# Patient Record
Sex: Male | Born: 2000 | Race: Black or African American | Hispanic: No | Marital: Single | Smoking: Never smoker
Health system: Southern US, Community
[De-identification: ages and names within clinical notes are randomized; demographics above are authoritative.]

## PROBLEM LIST (undated history)

## (undated) DIAGNOSIS — D571 Sickle-cell disease without crisis: Secondary | ICD-10-CM

## (undated) DIAGNOSIS — J45909 Unspecified asthma, uncomplicated: Secondary | ICD-10-CM

## (undated) DIAGNOSIS — H539 Unspecified visual disturbance: Secondary | ICD-10-CM

## (undated) DIAGNOSIS — K011 Impacted teeth: Secondary | ICD-10-CM

## (undated) DIAGNOSIS — T7840XA Allergy, unspecified, initial encounter: Secondary | ICD-10-CM

## (undated) DIAGNOSIS — F909 Attention-deficit hyperactivity disorder, unspecified type: Secondary | ICD-10-CM

## (undated) DIAGNOSIS — R079 Chest pain, unspecified: Secondary | ICD-10-CM

## (undated) DIAGNOSIS — J189 Pneumonia, unspecified organism: Secondary | ICD-10-CM

## (undated) HISTORY — PX: NO PAST SURGERIES: SHX2092

## (undated) HISTORY — DX: Unspecified asthma, uncomplicated: J45.909

## (undated) HISTORY — DX: Sickle-cell disease without crisis: D57.1

---
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
## 2022-02-17 ENCOUNTER — Emergency Department (HOSPITAL_COMMUNITY): Payer: Medicaid Other

## 2022-02-17 ENCOUNTER — Other Ambulatory Visit: Payer: Self-pay

## 2022-02-17 ENCOUNTER — Encounter (HOSPITAL_COMMUNITY): Payer: Self-pay | Admitting: Emergency Medicine

## 2022-02-17 ENCOUNTER — Emergency Department (HOSPITAL_COMMUNITY)
Admission: EM | Admit: 2022-02-17 | Discharge: 2022-02-17 | Disposition: A | Discharge status: Home / Self Care | Payer: Medicaid Other | Attending: Emergency Medicine | Admitting: Emergency Medicine

## 2022-02-17 DIAGNOSIS — J45909 Unspecified asthma, uncomplicated: Secondary | ICD-10-CM | POA: Diagnosis not present

## 2022-02-17 DIAGNOSIS — D5700 Hb-SS disease with crisis, unspecified: Secondary | ICD-10-CM | POA: Diagnosis present

## 2022-02-17 LAB — CBC WITH DIFFERENTIAL/PLATELET
Abs Immature Granulocytes: 0.05 10*3/uL (ref 0.00–0.07)
Basophils Absolute: 0 10*3/uL (ref 0.0–0.1)
Basophils Relative: 0 %
Eosinophils Absolute: 0.1 10*3/uL (ref 0.0–0.5)
Eosinophils Relative: 1 %
HCT: 35.8 % — ABNORMAL LOW (ref 39.0–52.0)
Hemoglobin: 12.4 g/dL — ABNORMAL LOW (ref 13.0–17.0)
Immature Granulocytes: 1 %
Lymphocytes Relative: 30 %
Lymphs Abs: 2 10*3/uL (ref 0.7–4.0)
MCH: 23.6 pg — ABNORMAL LOW (ref 26.0–34.0)
MCHC: 34.6 g/dL (ref 30.0–36.0)
MCV: 68.1 fL — ABNORMAL LOW (ref 80.0–100.0)
Monocytes Absolute: 0.5 10*3/uL (ref 0.1–1.0)
Monocytes Relative: 8 %
Neutro Abs: 4 10*3/uL (ref 1.7–7.7)
Neutrophils Relative %: 60 %
Platelets: 156 10*3/uL (ref 150–400)
RBC: 5.26 MIL/uL (ref 4.22–5.81)
RDW: 15.6 % — ABNORMAL HIGH (ref 11.5–15.5)
WBC: 6.7 10*3/uL (ref 4.0–10.5)
nRBC: 0 % (ref 0.0–0.2)

## 2022-02-17 LAB — COMPREHENSIVE METABOLIC PANEL
ALT: 17 U/L (ref 0–44)
AST: 18 U/L (ref 15–41)
Albumin: 4.7 g/dL (ref 3.5–5.0)
Alkaline Phosphatase: 60 U/L (ref 38–126)
Anion gap: 6 (ref 5–15)
BUN: 7 mg/dL (ref 6–20)
CO2: 26 mmol/L (ref 22–32)
Calcium: 9.2 mg/dL (ref 8.9–10.3)
Chloride: 106 mmol/L (ref 98–111)
Creatinine, Ser: 0.77 mg/dL (ref 0.61–1.24)
GFR, Estimated: >60 mL/min (ref >60)
Glucose, Bld: 104 mg/dL — ABNORMAL HIGH (ref 70–99)
Potassium: 3.5 mmol/L (ref 3.5–5.1)
Sodium: 138 mmol/L (ref 135–145)
Total Bilirubin: 2 mg/dL — ABNORMAL HIGH (ref 0.3–1.2)
Total Protein: 7.3 g/dL (ref 6.5–8.1)

## 2022-02-17 LAB — RETICULOCYTES
Immature Retic Fract: 17.9 % — ABNORMAL HIGH (ref 2.3–15.9)
RBC.: 5.21 MIL/uL (ref 4.22–5.81)
Retic Count, Absolute: 89.6 10*3/uL (ref 19.0–186.0)
Retic Ct Pct: 1.7 % (ref 0.4–3.1)

## 2022-02-17 MED ORDER — OXYCODONE-ACETAMINOPHEN 5-325 MG PO TABS: 1.0000 | ORAL_TABLET | Freq: Four times a day (QID) | ORAL | 0 refills | Status: DC | PRN

## 2022-02-17 MED ORDER — SODIUM CHLORIDE 0.45 % IV SOLN: INTRAVENOUS | Status: DC

## 2022-02-17 MED ORDER — ONDANSETRON HCL 4 MG/2ML IJ SOLN
4.0000 mg | INTRAMUSCULAR | Status: DC | PRN
  Filled 2022-02-17: qty 2

## 2022-02-17 MED ORDER — HYDROMORPHONE HCL 2 MG/ML IJ SOLN
2.0000 mg | INTRAMUSCULAR | Status: AC
  Administered 2022-02-17: 2 mg via INTRAVENOUS
  Filled 2022-02-17: qty 1

## 2022-02-17 MED ORDER — SODIUM CHLORIDE 0.9 % IV SOLN
12.5000 mg | Freq: Once | INTRAVENOUS | Status: AC
  Administered 2022-02-17: 12.5 mg via INTRAVENOUS
  Filled 2022-02-17: qty 12.5

## 2022-02-17 MED ORDER — OXYCODONE HCL 5 MG PO TABS
10.0000 mg | ORAL_TABLET | Freq: Once | ORAL | Status: AC
  Administered 2022-02-17: 10 mg via ORAL
  Filled 2022-02-17: qty 2

## 2022-02-17 MED ORDER — HYDROMORPHONE HCL 2 MG/ML IJ SOLN
2.0000 mg | Freq: Once | INTRAMUSCULAR | Status: AC
  Administered 2022-02-17: 2 mg via INTRAVENOUS
  Filled 2022-02-17: qty 1

## 2022-02-17 MED ORDER — KETOROLAC TROMETHAMINE 15 MG/ML IJ SOLN
15.0000 mg | INTRAMUSCULAR | Status: AC
  Administered 2022-02-17: 15 mg via INTRAVENOUS
  Filled 2022-02-17: qty 1

## 2022-02-17 MED ORDER — OXYCODONE-ACETAMINOPHEN 5-325 MG PO TABS
2.0000 | ORAL_TABLET | Freq: Once | ORAL | Status: AC
  Administered 2022-02-17: 2 via ORAL
  Filled 2022-02-17: qty 2

## 2022-02-17 NOTE — Discharge Instructions (Addendum)
You were seen in the emergency department today for sickle cell crisis.  I am glad that we were able to get your pain under control.  Your labs and imaging today overall looked reassuring.  I am prescribing you a short course of pain medication for you to take at home.  I like you to touch base with your primary care doctor, to discuss long-term management of your symptoms.  Continue to monitor how you are doing and return to the emergency department for any new or worsening symptoms such as fever, persistent chest pain, shortness of breath or cough.

## 2022-02-17 NOTE — ED Provider Triage Note (Signed)
Emergency Medicine Provider Triage Evaluation Note  Maurice Ferguson , a 21 y.o. male  was evaluated in triage.  Pt complains of crisis pain.  Pt complains of pain inn his chest and back  Review of Systems  Positive: pain Negative: Fever  no cough  Physical Exam  BP (!) 161/93 (BP Location: Right Arm)    Pulse 98    Temp 98.5 F (36.9 C) (Oral)    Resp 18    SpO2 100%  Gen:   Awake, no distress   Resp:  Normal effort  MSK:   Moves extremities without difficulty  Other:    Medical Decision Making  Medically screening exam initiated at 5:14 PM.  Appropriate orders placed.  Maurice Ferguson was informed that the remainder of the evaluation will be completed by another provider, this initial triage assessment does not replace that evaluation, and the importance of remaining in the ED until their evaluation is complete.  Sickle cell protocol ordered    Elson Areas, PA-C 02/17/22 1715

## 2022-02-17 NOTE — ED Triage Notes (Signed)
BIBA Per EMS: Pt coming from home w/ c/o SCC. Pt reports Pain everywhere.  VSS

## 2022-02-17 NOTE — ED Provider Notes (Signed)
[facility] DEPT Provider Note   CSN: [id number] Arrival date & time: 02/17/22  1633     History  Chief Complaint  Patient presents with   Sickle Cell Pain Crisis    Maurice Ferguson is a 21 y.o. male with history of sickle cell disease who presents the emergency department complaining of sickle cell crisis.  Patient states that he is having pain "all over", but worse in his chest and his back.  He initially had shortness of breath upon ER arrival, states this is gotten slightly better.  No fever, chills, cough.   Sickle Cell Pain Crisis Associated symptoms: chest pain   Associated symptoms: no cough, no fever, no nausea, no shortness of breath and no vomiting       Home Medications Prior to Admission medications   Medication Sig Start Date End Date Taking? Authorizing Provider  oxyCODONE-acetaminophen (PERCOCET/ROXICET) 5-325 MG tablet Take 1 tablet by mouth every 6 (six) hours as needed for severe pain. 02/17/22  Yes Lorenz Donley T, PA-C  acetaminophen (TYLENOL) 500 MG tablet Take 500 mg by mouth every 6 (six) hours as needed (pain).    [provider]  albuterol (VENTOLIN HFA) 108 (90 Base) MCG/ACT inhaler Inhale 2 puffs into the lungs every 6 (six) hours as needed for wheezing or shortness of breath.    [provider]  oxyCODONE (ROXICODONE) 5 MG immediate release tablet Take 1 tablet (5 mg total) by mouth every 8 (eight) hours as needed. Patient not taking: No sig reported 06/12/21 06/12/22  Elwyn Reach, MD      Allergies    Patient has no known allergies.    Review of Systems   Review of Systems  Constitutional:  Negative for chills and fever.  Respiratory:  Negative for cough and shortness of breath.   Cardiovascular:  Positive for chest pain.  Gastrointestinal:  Negative for nausea and vomiting.  Musculoskeletal:  Positive for back pain and myalgias.  All other systems reviewed and are negative.  Physical  Exam Updated Vital Signs BP (!) 142/98    Pulse 76    Temp 98.5 F (36.9 C) (Oral)    Resp (!) 22    SpO2 100%  Physical Exam Vitals and nursing note reviewed.  Constitutional:      Appearance: Normal appearance.  HENT:     Head: Normocephalic and atraumatic.  Eyes:     Conjunctiva/sclera: Conjunctivae normal.  Cardiovascular:     Rate and Rhythm: Normal rate and regular rhythm.  Pulmonary:     Effort: Pulmonary effort is normal. No respiratory distress.     Breath sounds: Normal breath sounds.  Abdominal:     General: There is no distension.     Palpations: Abdomen is soft.     Tenderness: There is no abdominal tenderness.  Skin:    General: Skin is warm and dry.  Neurological:     General: No focal deficit present.     Mental Status: He is alert.    ED Results / Procedures / Treatments   Labs (all labs ordered are listed, but only abnormal results are displayed) Labs Reviewed  CBC WITH DIFFERENTIAL/PLATELET - Abnormal; Notable for the following components:      Result Value   Hemoglobin 12.4 (*)    HCT 35.8 (*)    MCV 68.1 (*)    MCH 23.6 (*)    RDW 15.6 (*)    All other components within normal limits  RETICULOCYTES -  Abnormal; Notable for the following components:   Immature Retic Fract 17.9 (*)    All other components within normal limits  COMPREHENSIVE METABOLIC PANEL - Abnormal; Notable for the following components:   Glucose, Bld 104 (*)    Total Bilirubin 2.0 (*)    All other components within normal limits    EKG None  Radiology DG Chest 2 View  Result Date: 02/17/2022 CLINICAL DATA:  Chest pain, sickle cell disease EXAM: CHEST - 2 VIEW COMPARISON:  07/04/2021 FINDINGS: The heart size and mediastinal contours are within normal limits. Both lungs are clear. The visualized skeletal structures are unremarkable. IMPRESSION: No active cardiopulmonary disease. Electronically Signed   By: Elmer Picker M.D.   On: 02/17/2022 19:33     Procedures Procedures    Medications Ordered in ED Medications  0.45 % sodium chloride infusion ( Intravenous New Bag/Given 02/17/22 1740)  ondansetron (ZOFRAN) injection 4 mg (has no administration in time range)  ketorolac (TORADOL) 15 MG/ML injection 15 mg (15 mg Intravenous Given 02/17/22 1740)  HYDROmorphone (DILAUDID) injection 2 mg (2 mg Intravenous Given 02/17/22 1740)  diphenhydrAMINE (BENADRYL) 12.5 mg in sodium chloride 0.9 % 50 mL IVPB (0 mg Intravenous Stopped 02/17/22 1818)  HYDROmorphone (DILAUDID) injection 2 mg (2 mg Intravenous Given 02/17/22 1946)  oxyCODONE (Oxy IR/ROXICODONE) immediate release tablet 10 mg (10 mg Oral Given 02/17/22 2055)  oxyCODONE-acetaminophen (PERCOCET/ROXICET) 5-325 MG per tablet 2 tablet (2 tablets Oral Given 02/17/22 2130)    ED Course/ Medical Decision Making/ A&P                           Medical Decision Making Amount and/or Complexity of Data Reviewed Radiology: ordered.  Risk Prescription drug management.   This patient is a 21 year old male who presents to the ED for concern of sickle cell crisis.   Differential diagnoses prior to evaluation: The emergent differential diagnosis includes, but is not limited to, sickle cell crisis, acute chest syndrome, sequestration, anaplastic anemia.   Past Medical History / Co-morbidities: Sickle cell anemia, asthma, ADHD  Physical Exam: Physical exam performed. The pertinent findings include: Patient is afebrile, not tachycardic, not hypoxic, no acute distress.  Complaining of generalized myalgias, worse in the chest and the back.  No reproducible tenderness.  Lab Tests/Imaging studies: I Ordered, and personally interpreted labs/imaging including CBC, CMP, reticulocytes, chest x-ray.  The pertinent results include: Hemoglobin 12.4, stable compared to prior.  Absolute reticulocyte count 89.6.  Electrolytes within normal limits.  Chest x-ray showed no acute cardiopulmonary abnormalities. I agree  with the radiologist interpretation.   Medications: I ordered medication including IV fluids, analgesia, antiemetics for sickle cell crisis.  I have reviewed the patients home medicines and have made adjustments as needed.   Disposition: After consideration of the diagnostic results and the patients response to treatment, I feel that patient's not requiring admission or inpatient treatment for his symptoms.  I feel reassured that we are able to control his pain while in the emergency department.  Patient got 2 doses of IV pain medication, and successfully transition to oral medication.  As he does not have medication to take on a daily basis, we will prescribe him a short course of pain medicine for home.  As he continues to have good oxygen saturation, clear lungs, and a normal chest x-ray, I have low concern for acute chest syndrome.  Patient is stable for discharge home.  Discussed reasons to return to the emergency  department, and the patient is agreeable to the plan.   Final Clinical Impression(s) / ED Diagnoses Final diagnoses:  Sickle cell pain crisis ([place])    Rx / DC Orders ED Discharge Orders          Ordered    oxyCODONE-acetaminophen (PERCOCET/ROXICET) 5-325 MG tablet  Every 6 hours PRN        02/17/22 2136           Portions of this report may have been transcribed using voice recognition software. Every effort was made to ensure accuracy; however, inadvertent computerized transcription errors may be present.    Estill Cotta 02/17/22 2136    Tegeler, Gwenyth Allegra, MD 02/17/22 [phone]

## 2022-07-08 ENCOUNTER — Emergency Department (HOSPITAL_COMMUNITY): Payer: Medicaid Other

## 2022-07-08 ENCOUNTER — Other Ambulatory Visit: Payer: Self-pay

## 2022-07-08 ENCOUNTER — Emergency Department (HOSPITAL_COMMUNITY)
Admission: EM | Admit: 2022-07-08 | Discharge: 2022-07-09 | Disposition: A | Discharge status: Home / Self Care | Payer: Medicaid Other | Source: Home / Self Care | Attending: Emergency Medicine | Admitting: Emergency Medicine

## 2022-07-08 ENCOUNTER — Encounter (HOSPITAL_COMMUNITY): Payer: Self-pay

## 2022-07-08 DIAGNOSIS — D638 Anemia in other chronic diseases classified elsewhere: Secondary | ICD-10-CM | POA: Insufficient documentation

## 2022-07-08 DIAGNOSIS — I10 Essential (primary) hypertension: Secondary | ICD-10-CM | POA: Insufficient documentation

## 2022-07-08 DIAGNOSIS — D696 Thrombocytopenia, unspecified: Secondary | ICD-10-CM | POA: Diagnosis not present

## 2022-07-08 DIAGNOSIS — M549 Dorsalgia, unspecified: Secondary | ICD-10-CM | POA: Diagnosis present

## 2022-07-08 DIAGNOSIS — E876 Hypokalemia: Secondary | ICD-10-CM | POA: Insufficient documentation

## 2022-07-08 DIAGNOSIS — D72829 Elevated white blood cell count, unspecified: Secondary | ICD-10-CM | POA: Diagnosis not present

## 2022-07-08 DIAGNOSIS — D57219 Sickle-cell/Hb-C disease with crisis, unspecified: Secondary | ICD-10-CM | POA: Diagnosis not present

## 2022-07-08 DIAGNOSIS — J4530 Mild persistent asthma, uncomplicated: Secondary | ICD-10-CM | POA: Diagnosis not present

## 2022-07-08 DIAGNOSIS — D5700 Hb-SS disease with crisis, unspecified: Secondary | ICD-10-CM | POA: Insufficient documentation

## 2022-07-08 DIAGNOSIS — J45909 Unspecified asthma, uncomplicated: Secondary | ICD-10-CM | POA: Insufficient documentation

## 2022-07-08 DIAGNOSIS — G8929 Other chronic pain: Secondary | ICD-10-CM | POA: Insufficient documentation

## 2022-07-08 LAB — COMPREHENSIVE METABOLIC PANEL
ALT: 17 U/L (ref 0–44)
AST: 19 U/L (ref 15–41)
Albumin: 4.8 g/dL (ref 3.5–5.0)
Alkaline Phosphatase: 82 U/L (ref 38–126)
Anion gap: 7 (ref 5–15)
BUN: 7 mg/dL (ref 6–20)
CO2: 28 mmol/L (ref 22–32)
Calcium: 9.1 mg/dL (ref 8.9–10.3)
Chloride: 110 mmol/L (ref 98–111)
Creatinine, Ser: 0.75 mg/dL (ref 0.61–1.24)
GFR, Estimated: >60 mL/min (ref >60)
Glucose, Bld: 99 mg/dL (ref 70–99)
Potassium: 3.4 mmol/L — ABNORMAL LOW (ref 3.5–5.1)
Sodium: 145 mmol/L (ref 135–145)
Total Bilirubin: 1.4 mg/dL — ABNORMAL HIGH (ref 0.3–1.2)
Total Protein: 7.5 g/dL (ref 6.5–8.1)

## 2022-07-08 LAB — CBC WITH DIFFERENTIAL/PLATELET
Abs Immature Granulocytes: 0.14 10*3/uL — ABNORMAL HIGH (ref 0.00–0.07)
Basophils Absolute: 0 10*3/uL (ref 0.0–0.1)
Basophils Relative: 0 %
Eosinophils Absolute: 0.2 10*3/uL (ref 0.0–0.5)
Eosinophils Relative: 2 %
HCT: 35.4 % — ABNORMAL LOW (ref 39.0–52.0)
Hemoglobin: 12.2 g/dL — ABNORMAL LOW (ref 13.0–17.0)
Immature Granulocytes: 1 %
Lymphocytes Relative: 22 %
Lymphs Abs: 2.2 10*3/uL (ref 0.7–4.0)
MCH: 23.5 pg — ABNORMAL LOW (ref 26.0–34.0)
MCHC: 34.5 g/dL (ref 30.0–36.0)
MCV: 68.2 fL — ABNORMAL LOW (ref 80.0–100.0)
Monocytes Absolute: 0.6 10*3/uL (ref 0.1–1.0)
Monocytes Relative: 6 %
Neutro Abs: 6.6 10*3/uL (ref 1.7–7.7)
Neutrophils Relative %: 69 %
Platelets: 152 10*3/uL (ref 150–400)
RBC: 5.19 MIL/uL (ref 4.22–5.81)
RDW: 15.8 % — ABNORMAL HIGH (ref 11.5–15.5)
WBC: 9.8 10*3/uL (ref 4.0–10.5)
nRBC: 0 % (ref 0.0–0.2)

## 2022-07-08 LAB — RETICULOCYTES
Immature Retic Fract: 17.4 % — ABNORMAL HIGH (ref 2.3–15.9)
RBC.: 5.21 MIL/uL (ref 4.22–5.81)
Retic Count, Absolute: 68.8 10*3/uL (ref 19.0–186.0)
Retic Ct Pct: 1.3 % (ref 0.4–3.1)

## 2022-07-08 MED ORDER — SODIUM CHLORIDE 0.9 % IV SOLN
12.5000 mg | Freq: Once | INTRAVENOUS | Status: AC
  Administered 2022-07-08: 12.5 mg via INTRAVENOUS
  Filled 2022-07-08: qty 12.5

## 2022-07-08 MED ORDER — HYDROMORPHONE HCL 2 MG/ML IJ SOLN: 2.0000 mg | INTRAMUSCULAR | Status: AC

## 2022-07-08 MED ORDER — ONDANSETRON HCL 4 MG/2ML IJ SOLN
4.0000 mg | INTRAMUSCULAR | Status: DC | PRN
  Administered 2022-07-08: 4 mg via INTRAVENOUS
  Filled 2022-07-08: qty 2

## 2022-07-08 MED ORDER — OXYCODONE-ACETAMINOPHEN 5-325 MG PO TABS
1.0000 | ORAL_TABLET | Freq: Once | ORAL | Status: AC
  Administered 2022-07-08: 1 via ORAL
  Filled 2022-07-08: qty 1

## 2022-07-08 MED ORDER — KETOROLAC TROMETHAMINE 15 MG/ML IJ SOLN
15.0000 mg | INTRAMUSCULAR | Status: AC
  Administered 2022-07-08: 15 mg via INTRAVENOUS
  Filled 2022-07-08: qty 1

## 2022-07-08 MED ORDER — HYDROMORPHONE HCL 2 MG/ML IJ SOLN
2.0000 mg | INTRAMUSCULAR | Status: AC
  Administered 2022-07-08: 2 mg via INTRAVENOUS
  Filled 2022-07-08: qty 1

## 2022-07-08 MED ORDER — ONDANSETRON 4 MG PO TBDP
4.0000 mg | ORAL_TABLET | Freq: Once | ORAL | Status: AC
  Administered 2022-07-08: 4 mg via ORAL
  Filled 2022-07-08: qty 1
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
## 2022-07-08 NOTE — ED Triage Notes (Signed)
Pt reports with [facility] since today. Pt is complaining of lower back pain.
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
## 2022-07-09 ENCOUNTER — Emergency Department (HOSPITAL_COMMUNITY): Payer: Medicaid Other

## 2022-07-09 ENCOUNTER — Other Ambulatory Visit: Payer: Self-pay

## 2022-07-09 ENCOUNTER — Encounter (HOSPITAL_COMMUNITY): Payer: Self-pay

## 2022-07-09 ENCOUNTER — Observation Stay (HOSPITAL_COMMUNITY)
Admission: EM | Admit: 2022-07-09 | Discharge: 2022-07-11 | Disposition: A | Discharge status: Home / Self Care | Payer: Medicaid Other | Attending: Internal Medicine | Admitting: Internal Medicine

## 2022-07-09 DIAGNOSIS — J4530 Mild persistent asthma, uncomplicated: Secondary | ICD-10-CM | POA: Diagnosis present

## 2022-07-09 DIAGNOSIS — D5700 Hb-SS disease with crisis, unspecified: Secondary | ICD-10-CM

## 2022-07-09 DIAGNOSIS — I10 Essential (primary) hypertension: Secondary | ICD-10-CM | POA: Diagnosis present

## 2022-07-09 LAB — RETICULOCYTES
Immature Retic Fract: 22.2 % — ABNORMAL HIGH (ref 2.3–15.9)
RBC.: 5.09 MIL/uL (ref 4.22–5.81)
Retic Count, Absolute: 72.3 10*3/uL (ref 19.0–186.0)
Retic Ct Pct: 1.4 % (ref 0.4–3.1)

## 2022-07-09 MED ORDER — SODIUM CHLORIDE 0.45 % IV SOLN: INTRAVENOUS | Status: DC

## 2022-07-09 MED ORDER — HYDROMORPHONE HCL 2 MG/ML IJ SOLN: 2.0000 mg | INTRAMUSCULAR | Status: DC

## 2022-07-09 MED ORDER — ONDANSETRON HCL 4 MG/2ML IJ SOLN
4.0000 mg | INTRAMUSCULAR | Status: DC | PRN
  Administered 2022-07-10: 4 mg via INTRAVENOUS
  Filled 2022-07-09: qty 2

## 2022-07-09 MED ORDER — HYDROMORPHONE HCL 1 MG/ML IJ SOLN
1.0000 mg | INTRAMUSCULAR | Status: AC
  Administered 2022-07-09: 1 mg via INTRAVENOUS
  Filled 2022-07-09: qty 1

## 2022-07-09 MED ORDER — SODIUM CHLORIDE 0.9 % IV SOLN
12.5000 mg | Freq: Once | INTRAVENOUS | Status: AC
  Administered 2022-07-09: 12.5 mg via INTRAVENOUS
  Filled 2022-07-09: qty 12.5

## 2022-07-09 MED ORDER — OXYCODONE-ACETAMINOPHEN 5-325 MG PO TABS: 1.0000 | ORAL_TABLET | ORAL | 0 refills | Status: DC | PRN

## 2022-07-09 MED ORDER — HYDROMORPHONE HCL 2 MG/ML IJ SOLN
2.0000 mg | INTRAMUSCULAR | Status: AC
  Administered 2022-07-10: 2 mg via INTRAVENOUS
  Filled 2022-07-09: qty 1

## 2022-07-09 NOTE — ED Provider Notes (Signed)
2:36 AM Patient remains somnolent though he has received no additional doses of narcotics.  He has not had a prescription for narcotic medication since March of this year.  He has very few visits to the ED for sickle cell pain crises.  We will prescribe a short course of narcotic pain medication for use at home and avoid any additional parenteral narcotics in the ED.     Jonmarc Bodkin, Jonny Ruiz, MD 07/09/22 [phone]

## 2022-07-09 NOTE — ED Triage Notes (Signed)
Pt reports with [facility]. Pt has pain in his lower back that is uncontrolled with his home meds. Pt was seen last night.
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
## 2022-07-10 DIAGNOSIS — D5700 Hb-SS disease with crisis, unspecified: Secondary | ICD-10-CM

## 2022-07-10 LAB — COMPREHENSIVE METABOLIC PANEL
ALT: 21 U/L (ref 0–44)
AST: 23 U/L (ref 15–41)
Albumin: 4.4 g/dL (ref 3.5–5.0)
Alkaline Phosphatase: 71 U/L (ref 38–126)
Anion gap: 6 (ref 5–15)
BUN: 9 mg/dL (ref 6–20)
CO2: 29 mmol/L (ref 22–32)
Calcium: 9.1 mg/dL (ref 8.9–10.3)
Chloride: 111 mmol/L (ref 98–111)
Creatinine, Ser: 0.97 mg/dL (ref 0.61–1.24)
GFR, Estimated: >60 mL/min (ref >60)
Glucose, Bld: 98 mg/dL (ref 70–99)
Potassium: 3.4 mmol/L — ABNORMAL LOW (ref 3.5–5.1)
Sodium: 146 mmol/L — ABNORMAL HIGH (ref 135–145)
Total Bilirubin: 2.2 mg/dL — ABNORMAL HIGH (ref 0.3–1.2)
Total Protein: 7.2 g/dL (ref 6.5–8.1)

## 2022-07-10 LAB — CBC WITH DIFFERENTIAL/PLATELET
Abs Immature Granulocytes: 0.06 10*3/uL (ref 0.00–0.07)
Basophils Absolute: 0 10*3/uL (ref 0.0–0.1)
Basophils Relative: 0 %
Eosinophils Absolute: 0.1 10*3/uL (ref 0.0–0.5)
Eosinophils Relative: 1 %
HCT: 34.7 % — ABNORMAL LOW (ref 39.0–52.0)
Hemoglobin: 11.8 g/dL — ABNORMAL LOW (ref 13.0–17.0)
Immature Granulocytes: 1 %
Lymphocytes Relative: 11 %
Lymphs Abs: 1.2 10*3/uL (ref 0.7–4.0)
MCH: 23.2 pg — ABNORMAL LOW (ref 26.0–34.0)
MCHC: 34 g/dL (ref 30.0–36.0)
MCV: 68.3 fL — ABNORMAL LOW (ref 80.0–100.0)
Monocytes Absolute: 0.9 10*3/uL (ref 0.1–1.0)
Monocytes Relative: 7 %
Neutro Abs: 9.3 10*3/uL — ABNORMAL HIGH (ref 1.7–7.7)
Neutrophils Relative %: 80 %
Platelets: 135 10*3/uL — ABNORMAL LOW (ref 150–400)
RBC: 5.08 MIL/uL (ref 4.22–5.81)
RDW: 15.9 % — ABNORMAL HIGH (ref 11.5–15.5)
WBC: 11.6 10*3/uL — ABNORMAL HIGH (ref 4.0–10.5)
nRBC: 0 % (ref 0.0–0.2)

## 2022-07-10 MED ORDER — KETOROLAC TROMETHAMINE 15 MG/ML IJ SOLN
15.0000 mg | Freq: Four times a day (QID) | INTRAMUSCULAR | Status: DC
  Administered 2022-07-10 – 2022-07-11 (×6): 15 mg via INTRAVENOUS
  Filled 2022-07-10 (×6): qty 1

## 2022-07-10 MED ORDER — HYDROMORPHONE HCL 2 MG/ML IJ SOLN
2.0000 mg | Freq: Once | INTRAMUSCULAR | Status: AC
  Administered 2022-07-10: 2 mg via INTRAVENOUS
  Filled 2022-07-10: qty 1

## 2022-07-10 MED ORDER — DEXTROSE-NACL 5-0.45 % IV SOLN: INTRAVENOUS | Status: DC

## 2022-07-10 MED ORDER — SODIUM CHLORIDE 0.9% FLUSH
9.0000 mL | INTRAVENOUS | Status: DC | PRN
Start: 2022-07-10 — End: 2022-07-11

## 2022-07-10 MED ORDER — ONDANSETRON HCL 4 MG/2ML IJ SOLN: 4.0000 mg | Freq: Four times a day (QID) | INTRAMUSCULAR | Status: DC | PRN

## 2022-07-10 MED ORDER — ENOXAPARIN SODIUM 40 MG/0.4ML IJ SOSY
40.0000 mg | PREFILLED_SYRINGE | Freq: Every day | INTRAMUSCULAR | Status: DC
Start: 2022-07-10 — End: 2022-07-11

## 2022-07-10 MED ORDER — HYDROMORPHONE 1 MG/ML IV SOLN
INTRAVENOUS | Status: DC
  Administered 2022-07-10: 30 mg via INTRAVENOUS
  Administered 2022-07-11: 2 mg via INTRAVENOUS
  Administered 2022-07-11: 1.5 mg via INTRAVENOUS
  Filled 2022-07-10: qty 30

## 2022-07-10 MED ORDER — HYDROMORPHONE HCL 1 MG/ML IJ SOLN
0.5000 mg | Freq: Once | INTRAMUSCULAR | Status: AC
  Administered 2022-07-10: 0.5 mg via INTRAVENOUS
  Filled 2022-07-10: qty 1

## 2022-07-10 MED ORDER — KETOROLAC TROMETHAMINE 15 MG/ML IJ SOLN
15.0000 mg | Freq: Once | INTRAMUSCULAR | Status: AC
  Administered 2022-07-10: 15 mg via INTRAVENOUS
  Filled 2022-07-10: qty 1

## 2022-07-10 MED ORDER — NALOXONE HCL 0.4 MG/ML IJ SOLN: 0.4000 mg | INTRAMUSCULAR | Status: DC | PRN

## 2022-07-10 MED ORDER — SENNOSIDES-DOCUSATE SODIUM 8.6-50 MG PO TABS
1.0000 | ORAL_TABLET | Freq: Two times a day (BID) | ORAL | Status: DC
  Administered 2022-07-11: 1 via ORAL
  Filled 2022-07-10: qty 1

## 2022-07-10 MED ORDER — DIPHENHYDRAMINE HCL 25 MG PO CAPS
25.0000 mg | ORAL_CAPSULE | ORAL | Status: DC | PRN
Start: 2022-07-10 — End: 2022-07-11

## 2022-07-10 MED ORDER — POLYETHYLENE GLYCOL 3350 17 G PO PACK
17.0000 g | PACK | Freq: Every day | ORAL | Status: DC | PRN
Start: 2022-07-10 — End: 2022-07-11
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
## 2022-07-10 NOTE — ED Notes (Signed)
[place] removed, pt now back on RA, sats 99%

## 2022-07-10 NOTE — ED Notes (Signed)
Pt appears to be sleeping, even RR and unlabored, NAD noted, call bell in reach, side rails up x2 for safety, care on going, will continue to monitor. 

## 2022-07-10 NOTE — ED Notes (Signed)
Pt continues to sleep, even RR and unlabored, NAD noted, call bell in reach, side rails up x2 for safety, care on going, will continue to monitor.  

## 2022-07-10 NOTE — ED Notes (Signed)
Pt placed on 2L [place] d/t decrease sats after medication administration, PA Samantha aware.

## 2022-07-11 DIAGNOSIS — D5700 Hb-SS disease with crisis, unspecified: Secondary | ICD-10-CM | POA: Diagnosis not present

## 2022-07-11 MED ORDER — IBUPROFEN 800 MG PO TABS: 800.0000 mg | ORAL_TABLET | Freq: Three times a day (TID) | ORAL | 0 refills | Status: DC | PRN

## 2022-07-11 MED ORDER — OXYCODONE-ACETAMINOPHEN 5-325 MG PO TABS: 1.0000 | ORAL_TABLET | ORAL | 0 refills | Status: DC | PRN

## 2022-07-11 MED ORDER — FOLIC ACID 1 MG PO TABS
1.0000 mg | ORAL_TABLET | Freq: Every day | ORAL | Status: DC
  Administered 2022-07-11: 1 mg via ORAL
  Filled 2022-07-11: qty 1

## 2022-07-11 MED ORDER — OXYCODONE-ACETAMINOPHEN 5-325 MG PO TABS
1.0000 | ORAL_TABLET | ORAL | Status: DC | PRN
  Administered 2022-07-11: 1 via ORAL
  Filled 2022-07-11: qty 1

## 2022-07-11 MED ORDER — FOLIC ACID 1 MG PO TABS: 1.0000 mg | ORAL_TABLET | Freq: Every day | ORAL | 2 refills | Status: DC

## 2022-07-11 NOTE — Discharge Summary (Incomplete)
Physician Discharge Summary  Maurice Ferguson QVZ:[id number] DOB: June 15, 2001 DOA: 07/09/2022  PCP: Pcp, No  Admit date: 07/09/2022  Discharge date: 07/11/2022  Discharge Diagnoses:  Principal Problem:   Sickle cell pain crisis (HCC) Active Problems:   Hypertension   Mild persistent asthma, uncomplicated   Discharge Condition: Stable  Disposition:   Follow-up Information     Maurice Maroon, FNP Follow up.   Specialty: Family Medicine Contact information: [address] [place] [address] [phone]                Pt is discharged home in good condition and is to follow up with Pcp, No this week to have labs evaluated. Maurice Ferguson is instructed to increase activity slowly and balance with rest for the next few days, and use prescribed medication to complete treatment of pain  Diet: Regular Wt Readings from Last 3 Encounters:  07/10/22 54.4 kg (4 %, Z= -1.78)*  07/08/22 56.7 kg (7 %, Z= -1.46)*  07/04/21 58.5 kg (15 %, Z= -1.04)*   * Growth percentiles are based on CDC (Boys, 2-20 Years) data.    History of present illness:  Maurice Ferguson is a 21 year old male with a medical history significant for sickle cell disease, ADHD, history of acute chest syndrome, and history of mild intermittent asthma amongst other things who came to the emergency department with complaint of pain to lower back and legs that started 3 days ago.  Pain is consistent with his typical sickle cell crisis.  Patient has tried taking home medication regimen without relief.  Home medications consist of Tylenol and ibuprofen.  In the ER, he was equally treated with IV Dilaudid, but pain has only improved down to 8/10.  He denied any nausea or vomiting.  He apparently had 1 episode of vomitus at home.  No fever or chills.  Patient is being admitted to the hospital for further treatment and evaluation.  Hospital Course:  Sickle cell disease with pain crisis: Patient was admitted for  sickle cell pain crisis and managed appropriately with IVF, IV Dilaudid via PCA and IV Toradol, as well as other adjunct therapies per sickle cell pain management protocols.  Patient states that he is having minimal pain at 2/10 and is requesting discharge home.  He says that he does not have pain medications at home. Oxycodone-acetaminophen 5-325 mg every 4 hours as needed #20 was sent to patient's pharmacy to complete treatment.  Prior to prescribing opiate medications, PDMP was reviewed and is without inconsistencies.  Patient advised to reestablish care with his hematology team at [facility], he states that he has not been over the past several years.  Patient will discharge home with maintenance medication of folic acid, acetaminophen, and ibuprofen.  Discussed the importance of regular hydration and increased rest.  Patient expressed understanding.  Patient was therefore discharged home today in a hemodynamically stable condition.   Maurice Ferguson will follow-up with PCP within 1 week of this discharge. Maurice Ferguson was counseled extensively about nonpharmacologic means of pain management, patient verbalized understanding and was appreciative of  the care received during this admission.   We discussed the need for good hydration, monitoring of hydration status, avoidance of heat, cold, stress, and infection triggers. Patient was reminded of the need to seek medical attention immediately if any symptom of bleeding, anemia, or infection occurs.  Discharge Exam: Vitals:   07/11/22 0727 07/11/22 1139  BP:    Pulse:    Resp: 16  18  Temp:    SpO2: 100% 100%   Vitals:   07/11/22 0450 07/11/22 0508 07/11/22 0727 07/11/22 1139  BP:  109/70    Pulse:  88    Resp: 18 20 16 18   Temp:  98.3 F (36.8 C)    TempSrc:  Oral    SpO2: 100% 100% 100% 100%  Weight:      Height:        General appearance : Awake, alert, not in any distress. Speech Clear. Not toxic looking HEENT: Atraumatic and Normocephalic,  pupils equally reactive to light and accomodation Neck: Supple, no JVD. No cervical lymphadenopathy.  Chest: Good air entry bilaterally, no added sounds  CVS: S1 S2 regular, no murmurs.  Abdomen: Bowel sounds present, Non tender and not distended with no gaurding, rigidity or rebound. Extremities: B/L Lower Ext shows no edema, both legs are warm to touch Neurology: Awake alert, and oriented X 3, CN II-XII intact, Non focal Skin: No Rash  Discharge Instructions  Discharge Instructions     Discharge patient   Complete by: As directed    Discharge disposition: 01-Home or Self Care   Discharge patient date: 07/11/2022      Allergies as of 07/11/2022   No Known Allergies      Medication List     TAKE these medications    folic acid 1 MG tablet Commonly known as: FOLVITE Take 1 tablet (1 mg total) by mouth daily.   ibuprofen 800 MG tablet Commonly known as: ADVIL Take 1 tablet (800 mg total) by mouth every 8 (eight) hours as needed. What changed:  medication strength how much to take when to take this reasons to take this   oxyCODONE-acetaminophen 5-325 MG tablet Commonly known as: PERCOCET/ROXICET Take 1 tablet by mouth every 4 (four) hours as needed for severe pain.   TYLENOL 8 HOUR PO Take 1 tablet by mouth every 8 (eight) hours as needed (pain).        The results of significant diagnostics from this hospitalization (including imaging, microbiology, ancillary and laboratory) are listed below for reference.    Significant Diagnostic Studies: DG Hip Unilat With Pelvis 2-3 Views Left  Result Date: 07/10/2022 CLINICAL DATA:  Pain.  Sickle cell crisis. EXAM: DG HIP (WITH OR WITHOUT PELVIS) 2-3V LEFT COMPARISON:  Radiograph 12/03/2018 FINDINGS: No visualized bone infarct or radiographic evidence of avascular necrosis. The femoral head is well seated in the acetabulum. No fracture. The pubic rami are intact. Pubic symphysis and sacroiliac joints are congruent.  Unremarkable soft tissues. IMPRESSION: Negative radiographs of the left hip. Electronically Signed   By: Narda Rutherford M.D.   On: 07/10/2022 00:01   DG Thoracic Spine 2 View  Result Date: 07/08/2022 CLINICAL DATA:  Pain after fall EXAM: THORACIC SPINE 2 VIEWS COMPARISON:  None Available. FINDINGS: There is no evidence of thoracic spine fracture. Alignment is normal. No other significant bone abnormalities are identified. IMPRESSION: Negative. Electronically Signed   By: Charlett Nose M.D.   On: 07/08/2022 22:29   DG Lumbar Spine Complete  Result Date: 07/08/2022 CLINICAL DATA:  Pain after fall EXAM: LUMBAR SPINE - COMPLETE 4+ VIEW COMPARISON:  None Available. FINDINGS: There is no evidence of lumbar spine fracture. Alignment is normal. Intervertebral disc spaces are maintained. IMPRESSION: Negative. Electronically Signed   By: Charlett Nose M.D.   On: 07/08/2022 22:28   DG Chest 2 View  Result Date: 07/08/2022 CLINICAL DATA:  Shortness of breath.  Sickle cell crisis. EXAM:  CHEST - 2 VIEW COMPARISON:  02/17/2022 FINDINGS: The heart is normal in size.The cardiomediastinal contours are normal. The lungs are clear. Pulmonary vasculature is normal. No consolidation, pleural effusion, or pneumothorax. Mild small scoliotic curvature. No acute osseous abnormalities are seen. IMPRESSION: No acute findings or explanation for symptoms. Electronically Signed   By: Narda Rutherford M.D.   On: 07/08/2022 22:23    Microbiology: No results found for this or any previous visit (from the past 240 hour(s)).   Labs: Basic Metabolic Panel: Recent Labs  Lab 07/08/22 2056 07/09/22 2316  NA 145 146*  K 3.4* 3.4*  CL 110 111  CO2 28 29  GLUCOSE 99 98  BUN 7 9  CREATININE 0.75 0.97  CALCIUM 9.1 9.1   Liver Function Tests: Recent Labs  Lab 07/08/22 2056 07/09/22 2316  AST 19 23  ALT 17 21  ALKPHOS 82 71  BILITOT 1.4* 2.2*  PROT 7.5 7.2  ALBUMIN 4.8 4.4   No results for input(s): "LIPASE", "AMYLASE"  in the last 168 hours. No results for input(s): "AMMONIA" in the last 168 hours. CBC: Recent Labs  Lab 07/08/22 2056 07/09/22 2316  WBC 9.8 11.6*  NEUTROABS 6.6 9.3*  HGB 12.2* 11.8*  HCT 35.4* 34.7*  MCV 68.2* 68.3*  PLT 152 135*   Cardiac Enzymes: No results for input(s): "CKTOTAL", "CKMB", "CKMBINDEX", "TROPONINI" in the last 168 hours. BNP: Invalid input(s): "POCBNP" CBG: No results for input(s): "GLUCAP" in the last 168 hours.  Time coordinating discharge: 50 minutes  Signed:  Nolon Nations  APRN, MSN, FNP-C Patient Care [facility] [address] [place], [place] [address] [phone]  [facility] 07/13/2022, 5:35 PM  Evaluation and management procedures were performed by the Advanced Practitioner under my supervision and collaboration. I have reviewed the Advanced Practitioner's note and chart, and I agree with the management and plan.   Jeanann Lewandowsky, MD, MHA, CPE, [facility] Patient [facility] [place], [place] [phone] 07/13/2022, 6:07 PM

## 2022-07-11 NOTE — Plan of Care (Signed)
  Problem: Education: Goal: Knowledge of vaso-occlusive preventative measures will improve Outcome: Adequate for Discharge Goal: Awareness of infection prevention will improve Outcome: Adequate for Discharge Goal: Awareness of signs and symptoms of anemia will improve Outcome: Adequate for Discharge Goal: Long-term complications will improve Outcome: Adequate for Discharge   Problem: Self-Care: Goal: Ability to incorporate actions that prevent/reduce pain crisis will improve Outcome: Adequate for Discharge   Problem: Bowel/Gastric: Goal: Gut motility will be maintained Outcome: Adequate for Discharge   Problem: Tissue Perfusion: Goal: Complications related to inadequate tissue perfusion will be avoided or minimized Outcome: Adequate for Discharge   Problem: Respiratory: Goal: Pulmonary complications will be avoided or minimized Outcome: Adequate for Discharge Goal: Acute Chest Syndrome will be identified early to prevent complications Outcome: Adequate for Discharge   Problem: Fluid Volume: Goal: Ability to maintain a balanced intake and output will improve Outcome: Adequate for Discharge   Problem: Sensory: Goal: Pain level will decrease with appropriate interventions Outcome: Adequate for Discharge   Problem: Health Behavior: Goal: Postive changes in compliance with treatment and prescription regimens will improve Outcome: Adequate for Discharge   Problem: Education: Goal: Knowledge of General Education information will improve Description: Including pain rating scale, medication(s)/side effects and non-pharmacologic comfort measures Outcome: Adequate for Discharge   Problem: Health Behavior/Discharge Planning: Goal: Ability to manage health-related needs will improve Outcome: Adequate for Discharge   Problem: Clinical Measurements: Goal: Ability to maintain clinical measurements within normal limits will improve Outcome: Adequate for Discharge Goal: Will remain  free from infection Outcome: Adequate for Discharge Goal: Diagnostic test results will improve Outcome: Adequate for Discharge Goal: Respiratory complications will improve Outcome: Adequate for Discharge Goal: Cardiovascular complication will be avoided Outcome: Adequate for Discharge   Problem: Activity: Goal: Risk for activity intolerance will decrease Outcome: Adequate for Discharge   Problem: Nutrition: Goal: Adequate nutrition will be maintained Outcome: Adequate for Discharge   Problem: Coping: Goal: Level of anxiety will decrease Outcome: Adequate for Discharge   Problem: Elimination: Goal: Will not experience complications related to bowel motility Outcome: Adequate for Discharge Goal: Will not experience complications related to urinary retention Outcome: Adequate for Discharge   Problem: Pain Managment: Goal: General experience of comfort will improve Outcome: Adequate for Discharge   Problem: Safety: Goal: Ability to remain free from injury will improve Outcome: Adequate for Discharge   Problem: Skin Integrity: Goal: Risk for impaired skin integrity will decrease Outcome: Adequate for Discharge   

## 2022-07-11 NOTE — TOC CM/SW Note (Signed)
  Transition of Care [facility]) Screening Note   Patient Details  Name: Maurice Ferguson Date of Birth: [birth year]   Transition of Care [facility]) CM/SW Contact:    Darleene Cleaver, LCSW Phone Number: 07/11/2022, 12:53 PM    Transition of Care Department [facility]) has reviewed patient and no TOC needs have been identified at this time. We will continue to monitor patient advancement through interdisciplinary progression rounds. If new patient transition needs arise, please place a TOC consult.

## 2022-09-10 IMAGING — CT CT ANGIO CHEST
2 of 7 series · 18 of 46 positions shown · IV contrast (omnipaque)
Comparison: Portable chest obtained earlier today.

CLINICAL DATA: Chest pain and shortness of breath. History of
sickle cell disease.

EXAM:
CT ANGIOGRAPHY CHEST WITH CONTRAST
TECHNIQUE: Multidetector CT imaging of the chest was performed using the
standard protocol during bolus administration of intravenous
contrast. Multiplanar CT image reconstructions and MIPs were
obtained to evaluate the vascular anatomy.
CONTRAST:  75mL OMNIPAQUE IOHEXOL 350 MG/ML SOLN

[Series 6: thins · axial · 0.66mm/px · z∈[+1077,+1333]mm · 15 of 288 slices shown]
[im 16/288  lung]
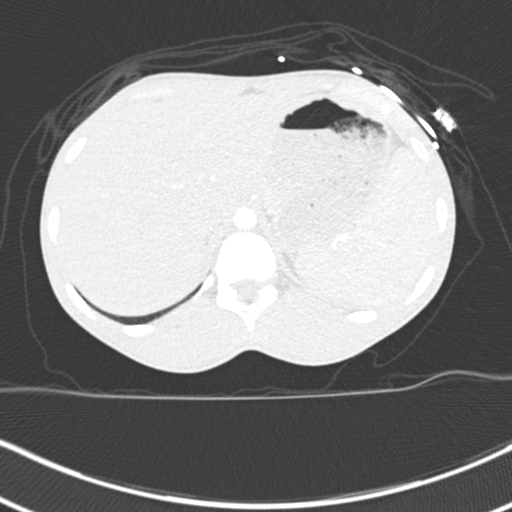
[im 32/288  soft-tissue]
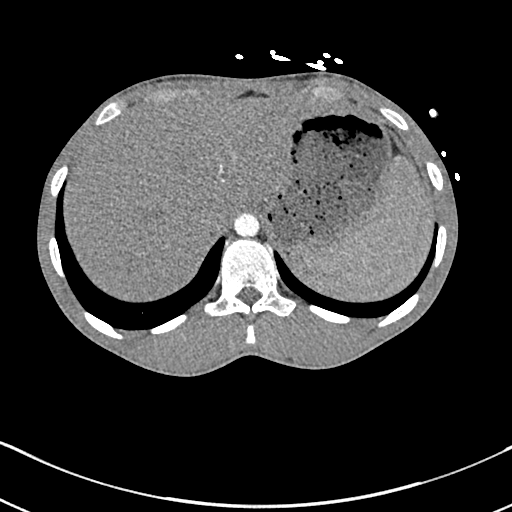
[im 48/288  lung]
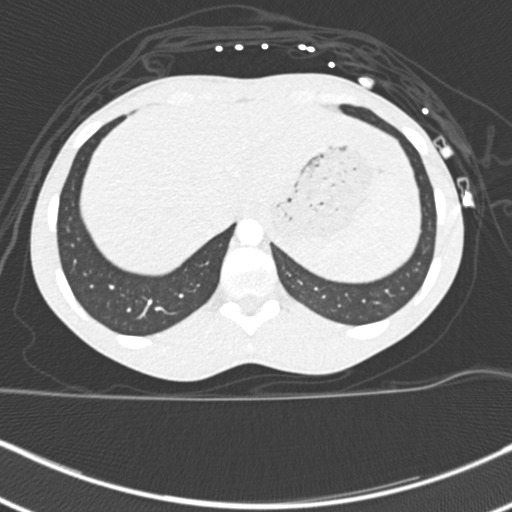
[im 64/288  soft-tissue]
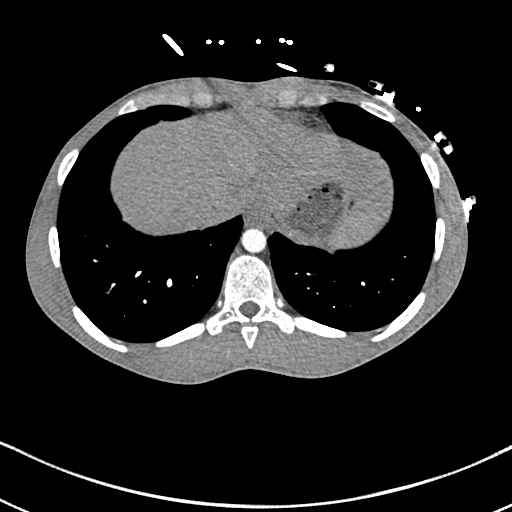
[im 96/288  lung]
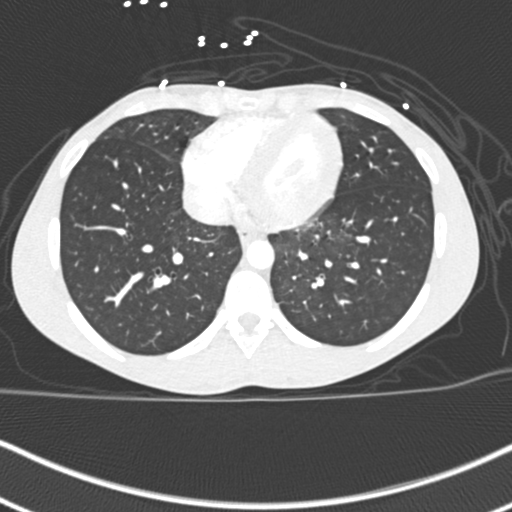
[im 112/288  soft-tissue]
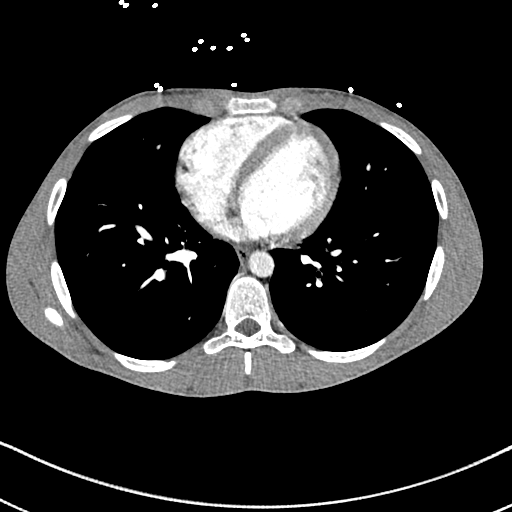
[im 128/288  lung]
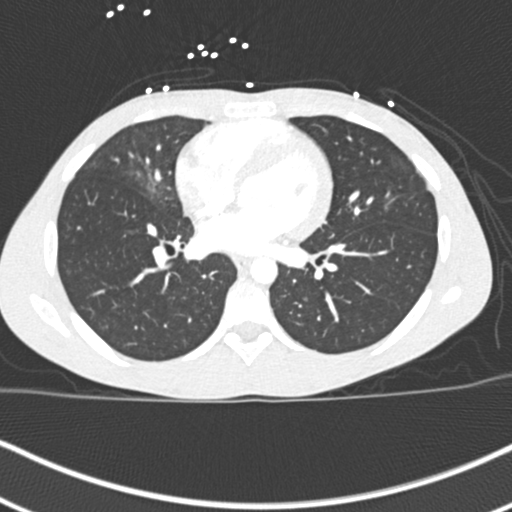
[im 144/288  soft-tissue]
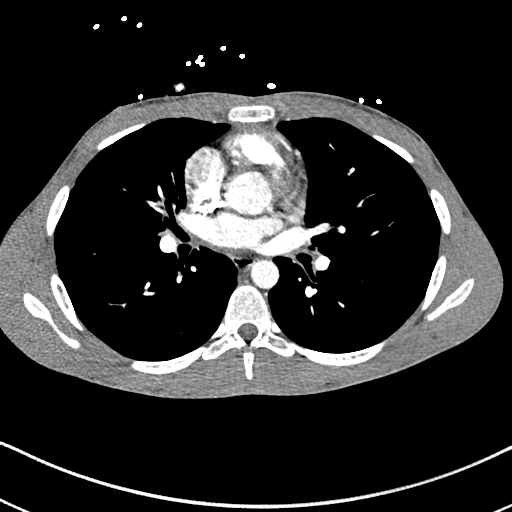
[im 160/288  lung]
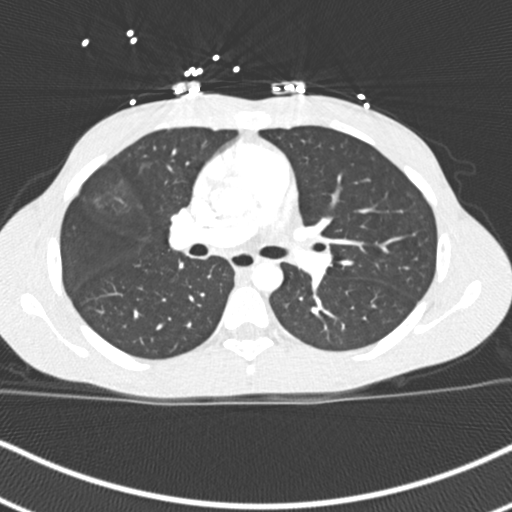
[im 176/288  soft-tissue]
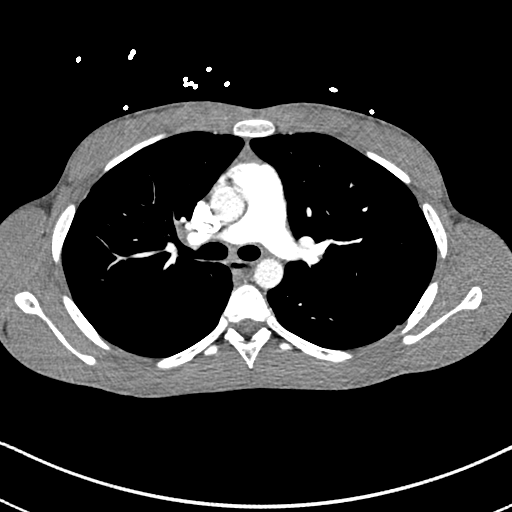
[im 192/288  lung]
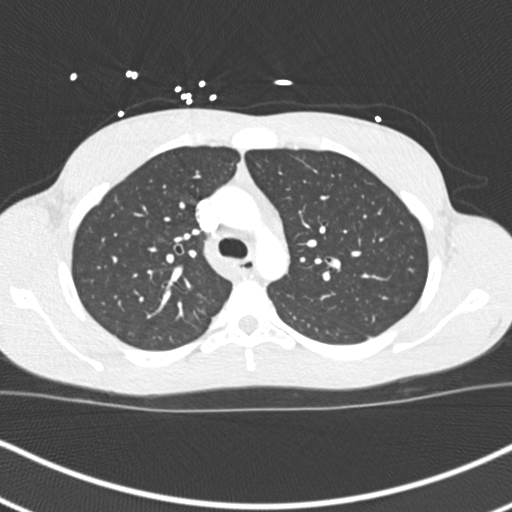
[im 224/288  soft-tissue]
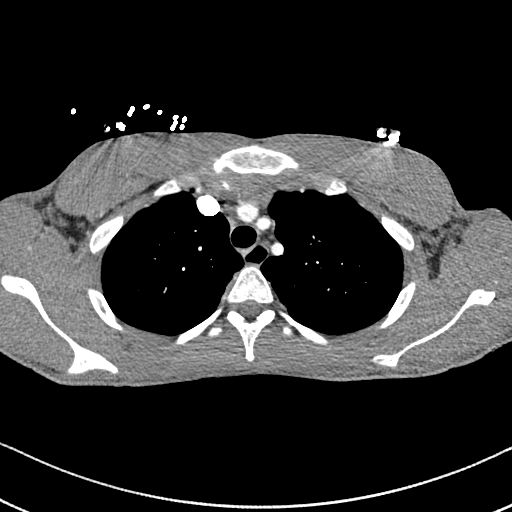
[im 240/288  lung]
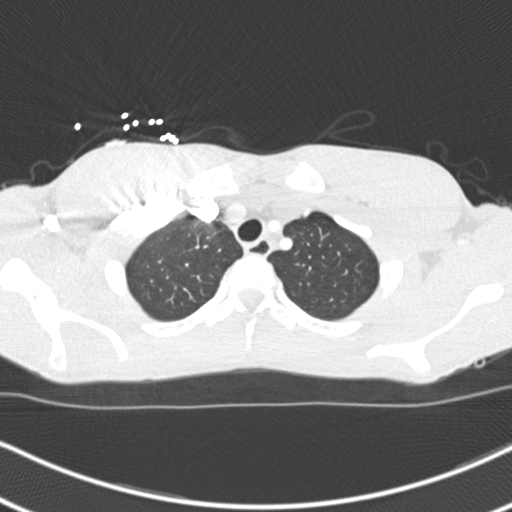
[im 256/288  soft-tissue]
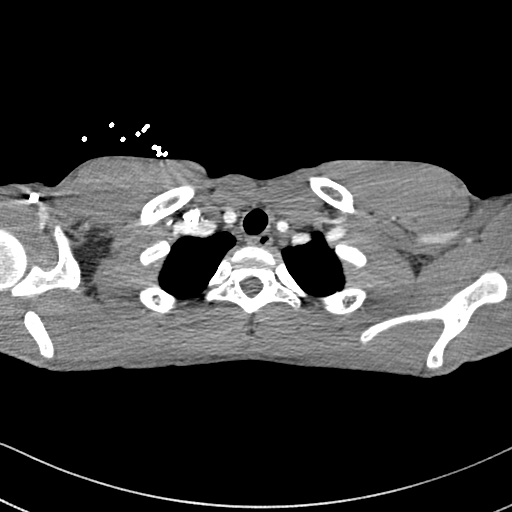
[im 272/288  lung]
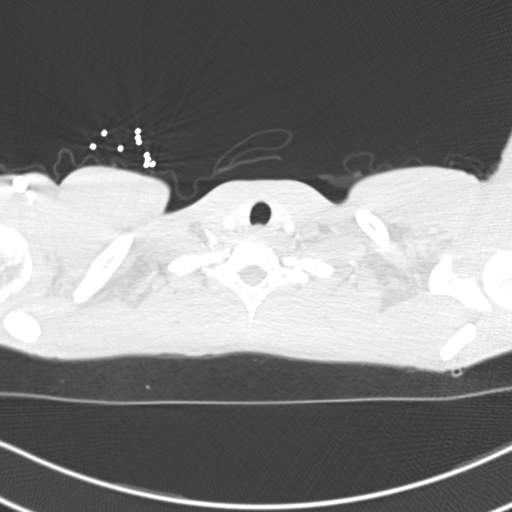

[Series 8: coronal mpr · coronal · 0.59mm/px · 3 of 119 slices shown]
[im 30/119  soft-tissue]
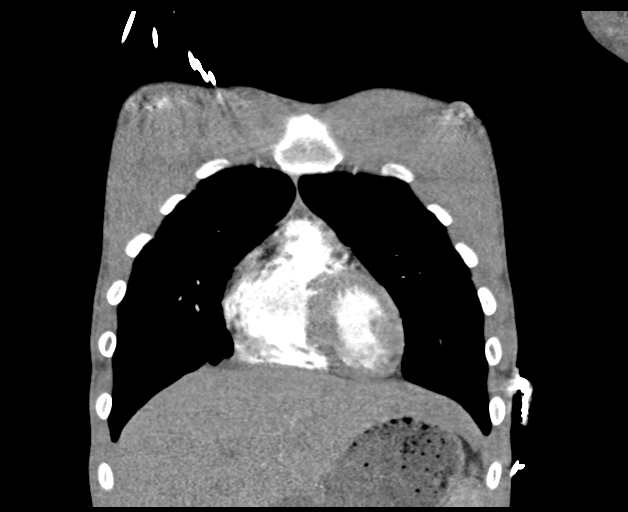
[im 60/119  soft-tissue]
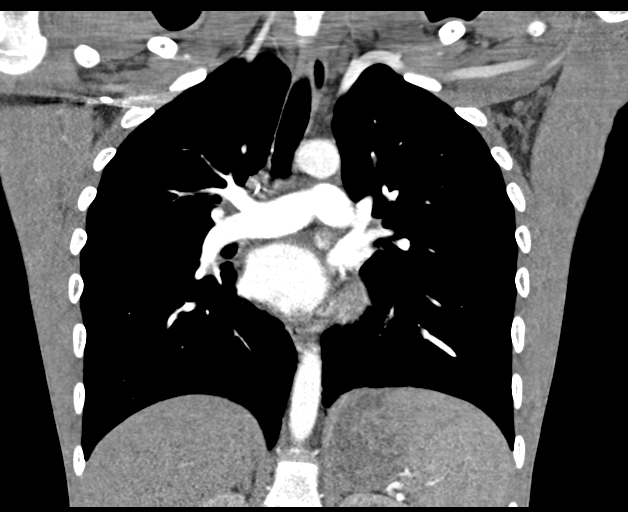
[im 89/119  soft-tissue]
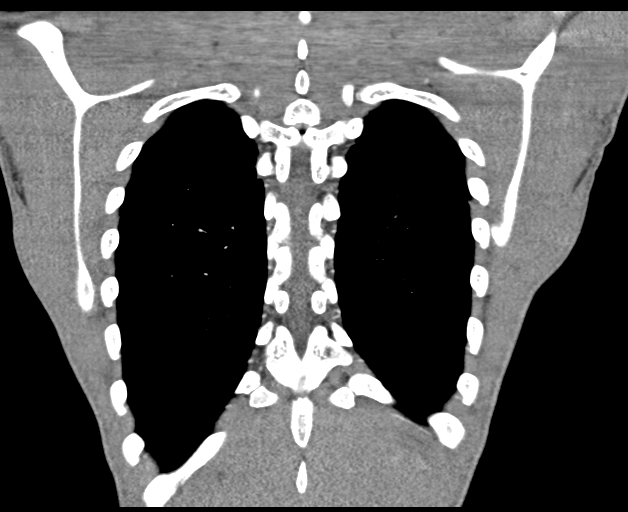

[18 of 46 positions shown; findings below may reference images not displayed]

FINDINGS: Cardiovascular: Satisfactory opacification of the pulmonary arteries
to the segmental level. No evidence of pulmonary embolism. Normal
heart size. No pericardial effusion.

Mediastinum/Nodes: No enlarged mediastinal, hilar, or axillary lymph
nodes. Thyroid gland, trachea, and esophagus demonstrate no
significant findings.

Lungs/Pleura: Minimal patchy opacity in linear atelectasis in the
right middle lobe, best seen on the coronal and sagittal
reconstructed images. The remainder of the lungs are clear. No
pleural fluid.

Upper Abdomen: Unremarkable.

Musculoskeletal: Normal appearing bones.

Review of the MIP images confirms the above findings.
IMPRESSION: 1. Minimal atelectasis and possible pneumonia in the right middle
lobe.
2. No pulmonary emboli.

## 2022-11-10 ENCOUNTER — Emergency Department (HOSPITAL_COMMUNITY): Payer: Medicaid Other

## 2022-11-10 ENCOUNTER — Other Ambulatory Visit: Payer: Self-pay

## 2022-11-10 ENCOUNTER — Emergency Department (HOSPITAL_COMMUNITY)
Admission: EM | Admit: 2022-11-10 | Discharge: 2022-11-10 | Disposition: A | Discharge status: Home / Self Care | Payer: Medicaid Other | Attending: Emergency Medicine | Admitting: Emergency Medicine

## 2022-11-10 ENCOUNTER — Encounter (HOSPITAL_COMMUNITY): Payer: Self-pay

## 2022-11-10 DIAGNOSIS — T07XXXA Unspecified multiple injuries, initial encounter: Secondary | ICD-10-CM

## 2022-11-10 DIAGNOSIS — S41101A Unspecified open wound of right upper arm, initial encounter: Secondary | ICD-10-CM | POA: Insufficient documentation

## 2022-11-10 DIAGNOSIS — Z23 Encounter for immunization: Secondary | ICD-10-CM | POA: Diagnosis not present

## 2022-11-10 DIAGNOSIS — D5700 Hb-SS disease with crisis, unspecified: Secondary | ICD-10-CM

## 2022-11-10 DIAGNOSIS — D57219 Sickle-cell/Hb-C disease with crisis, unspecified: Secondary | ICD-10-CM | POA: Diagnosis not present

## 2022-11-10 DIAGNOSIS — S3991XA Unspecified injury of abdomen, initial encounter: Secondary | ICD-10-CM | POA: Diagnosis not present

## 2022-11-10 DIAGNOSIS — S71001A Unspecified open wound, right hip, initial encounter: Secondary | ICD-10-CM | POA: Insufficient documentation

## 2022-11-10 DIAGNOSIS — R0602 Shortness of breath: Secondary | ICD-10-CM | POA: Insufficient documentation

## 2022-11-10 DIAGNOSIS — S299XXA Unspecified injury of thorax, initial encounter: Secondary | ICD-10-CM | POA: Diagnosis not present

## 2022-11-10 DIAGNOSIS — W3400XA Accidental discharge from unspecified firearms or gun, initial encounter: Secondary | ICD-10-CM

## 2022-11-10 DIAGNOSIS — S31819A Unspecified open wound of right buttock, initial encounter: Secondary | ICD-10-CM | POA: Insufficient documentation

## 2022-11-10 DIAGNOSIS — J45909 Unspecified asthma, uncomplicated: Secondary | ICD-10-CM | POA: Diagnosis not present

## 2022-11-10 DIAGNOSIS — S4991XA Unspecified injury of right shoulder and upper arm, initial encounter: Secondary | ICD-10-CM | POA: Diagnosis present

## 2022-11-10 DIAGNOSIS — E876 Hypokalemia: Secondary | ICD-10-CM | POA: Diagnosis not present

## 2022-11-10 HISTORY — DX: Sickle-cell disease without crisis: D57.1

## 2022-11-10 HISTORY — DX: Unspecified asthma, uncomplicated: J45.909

## 2022-11-10 LAB — BASIC METABOLIC PANEL
Anion gap: 10 (ref 5–15)
BUN: 7 mg/dL (ref 6–20)
CO2: 22 mmol/L (ref 22–32)
Calcium: 9 mg/dL (ref 8.9–10.3)
Chloride: 110 mmol/L (ref 98–111)
Creatinine, Ser: 0.99 mg/dL (ref 0.61–1.24)
GFR, Estimated: >60 mL/min (ref >60)
Glucose, Bld: 110 mg/dL — ABNORMAL HIGH (ref 70–99)
Potassium: 3.9 mmol/L (ref 3.5–5.1)
Sodium: 142 mmol/L (ref 135–145)

## 2022-11-10 LAB — ETHANOL: Alcohol, Ethyl (B): <10 mg/dL (ref <10)

## 2022-11-10 LAB — I-STAT CHEM 8, ED
BUN: 5 mg/dL — ABNORMAL LOW (ref 6–20)
Calcium, Ion: 1.16 mmol/L (ref 1.15–1.40)
Chloride: 108 mmol/L (ref 98–111)
Creatinine, Ser: 1.1 mg/dL (ref 0.61–1.24)
Glucose, Bld: 223 mg/dL — ABNORMAL HIGH (ref 70–99)
HCT: 41 % (ref 39.0–52.0)
Hemoglobin: 13.9 g/dL (ref 13.0–17.0)
Potassium: 3.1 mmol/L — ABNORMAL LOW (ref 3.5–5.1)
Sodium: 142 mmol/L (ref 135–145)
TCO2: 13 mmol/L — ABNORMAL LOW (ref 22–32)

## 2022-11-10 LAB — I-STAT VENOUS BLOOD GAS, ED
Acid-base deficit: 2 mmol/L (ref 0.0–2.0)
Bicarbonate: 25.1 mmol/L (ref 20.0–28.0)
Calcium, Ion: 1.24 mmol/L (ref 1.15–1.40)
HCT: 36 % — ABNORMAL LOW (ref 39.0–52.0)
Hemoglobin: 12.2 g/dL — ABNORMAL LOW (ref 13.0–17.0)
O2 Saturation: 99 %
Potassium: 3.4 mmol/L — ABNORMAL LOW (ref 3.5–5.1)
Sodium: 142 mmol/L (ref 135–145)
TCO2: 27 mmol/L (ref 22–32)
pCO2, Ven: 49.3 mmHg (ref 44–60)
pH, Ven: 7.314 (ref 7.25–7.43)
pO2, Ven: 135 mmHg — ABNORMAL HIGH (ref 32–45)

## 2022-11-10 LAB — COMPREHENSIVE METABOLIC PANEL
ALT: 14 U/L (ref 0–44)
AST: 27 U/L (ref 15–41)
Albumin: 4.6 g/dL (ref 3.5–5.0)
Alkaline Phosphatase: 67 U/L (ref 38–126)
Anion gap: 24 — ABNORMAL HIGH (ref 5–15)
BUN: 6 mg/dL (ref 6–20)
CO2: 12 mmol/L — ABNORMAL LOW (ref 22–32)
Calcium: 9.6 mg/dL (ref 8.9–10.3)
Chloride: 107 mmol/L (ref 98–111)
Creatinine, Ser: 1.35 mg/dL — ABNORMAL HIGH (ref 0.61–1.24)
GFR, Estimated: >60 mL/min (ref >60)
Glucose, Bld: 230 mg/dL — ABNORMAL HIGH (ref 70–99)
Potassium: 3.1 mmol/L — ABNORMAL LOW (ref 3.5–5.1)
Sodium: 143 mmol/L (ref 135–145)
Total Bilirubin: 1.5 mg/dL — ABNORMAL HIGH (ref 0.3–1.2)
Total Protein: 6.5 g/dL (ref 6.5–8.1)

## 2022-11-10 LAB — LACTIC ACID, PLASMA
Lactic Acid, Venous: >9 mmol/L (ref 0.5–1.9)
Lactic Acid, Venous: 2.4 mmol/L (ref 0.5–1.9)

## 2022-11-10 LAB — CBC
HCT: 38.2 % — ABNORMAL LOW (ref 39.0–52.0)
Hemoglobin: 12.4 g/dL — ABNORMAL LOW (ref 13.0–17.0)
MCH: 22.8 pg — ABNORMAL LOW (ref 26.0–34.0)
MCHC: 32.5 g/dL (ref 30.0–36.0)
MCV: 70.2 fL — ABNORMAL LOW (ref 80.0–100.0)
Platelets: 156 10*3/uL (ref 150–400)
RBC: 5.44 MIL/uL (ref 4.22–5.81)
RDW: 15.4 % (ref 11.5–15.5)
WBC: 8.2 10*3/uL (ref 4.0–10.5)
nRBC: 0 % (ref 0.0–0.2)

## 2022-11-10 LAB — SAMPLE TO BLOOD BANK

## 2022-11-10 LAB — RETICULOCYTES
Immature Retic Fract: 10.4 % (ref 2.3–15.9)
RBC.: 4.94 MIL/uL (ref 4.22–5.81)
Retic Count, Absolute: 54.8 10*3/uL (ref 19.0–186.0)
Retic Ct Pct: 1.1 % (ref 0.4–3.1)

## 2022-11-10 LAB — PROTIME-INR
INR: 1.2 (ref 0.8–1.2)
Prothrombin Time: 15.3 seconds — ABNORMAL HIGH (ref 11.4–15.2)

## 2022-11-10 LAB — CK: Total CK: 504 U/L — ABNORMAL HIGH (ref 49–397)

## 2022-11-10 MED ORDER — MORPHINE SULFATE (PF) 4 MG/ML IV SOLN: 4.0000 mg | Freq: Once | INTRAVENOUS | Status: DC | PRN

## 2022-11-10 MED ORDER — KETOROLAC TROMETHAMINE 15 MG/ML IJ SOLN
30.0000 mg | Freq: Once | INTRAMUSCULAR | Status: AC
  Administered 2022-11-10: 30 mg via INTRAVENOUS
  Filled 2022-11-10: qty 2

## 2022-11-10 MED ORDER — LACTATED RINGERS IV BOLUS
1000.0000 mL | Freq: Once | INTRAVENOUS | Status: AC
  Administered 2022-11-10: 1000 mL via INTRAVENOUS

## 2022-11-10 MED ORDER — BACITRACIN ZINC 500 UNIT/GM EX OINT: TOPICAL_OINTMENT | Freq: Two times a day (BID) | CUTANEOUS | Status: DC

## 2022-11-10 MED ORDER — ONDANSETRON HCL 4 MG/2ML IJ SOLN
4.0000 mg | Freq: Once | INTRAMUSCULAR | Status: AC
  Administered 2022-11-10: 4 mg via INTRAVENOUS
  Filled 2022-11-10: qty 2

## 2022-11-10 MED ORDER — OXYCODONE HCL 5 MG PO TABS: 5.0000 mg | ORAL_TABLET | Freq: Four times a day (QID) | ORAL | 0 refills | Status: DC | PRN

## 2022-11-10 MED ORDER — MORPHINE SULFATE (PF) 4 MG/ML IV SOLN
4.0000 mg | Freq: Once | INTRAVENOUS | Status: AC
  Administered 2022-11-10: 4 mg via INTRAVENOUS
  Filled 2022-11-10: qty 1

## 2022-11-10 MED ORDER — OXYCODONE HCL 5 MG PO TABS
5.0000 mg | ORAL_TABLET | Freq: Four times a day (QID) | ORAL | 0 refills | Status: DC | PRN
  Filled 2022-11-10: qty 10, 3d supply, fill #0

## 2022-11-10 MED ORDER — IOHEXOL 350 MG/ML SOLN
65.0000 mL | Freq: Once | INTRAVENOUS | Status: AC | PRN
  Administered 2022-11-10: 65 mL via INTRAVENOUS

## 2022-11-10 MED ORDER — METHOCARBAMOL 500 MG PO TABS: 500.0000 mg | ORAL_TABLET | Freq: Two times a day (BID) | ORAL | 0 refills | Status: DC

## 2022-11-10 MED ORDER — POTASSIUM CHLORIDE CRYS ER 20 MEQ PO TBCR
40.0000 meq | EXTENDED_RELEASE_TABLET | Freq: Once | ORAL | Status: AC
  Administered 2022-11-10: 40 meq via ORAL
  Filled 2022-11-10: qty 2

## 2022-11-10 MED ORDER — TETANUS-DIPHTH-ACELL PERTUSSIS 5-2.5-18.5 LF-MCG/0.5 IM SUSY
0.5000 mL | PREFILLED_SYRINGE | Freq: Once | INTRAMUSCULAR | Status: AC
  Administered 2022-11-10: 0.5 mL via INTRAMUSCULAR

## 2022-11-10 MED ORDER — METHOCARBAMOL 500 MG PO TABS
500.0000 mg | ORAL_TABLET | Freq: Two times a day (BID) | ORAL | 0 refills | Status: DC
  Filled 2022-11-10: qty 20, 10d supply, fill #0

## 2022-11-10 MED ORDER — CEFAZOLIN SODIUM-DEXTROSE 2-4 GM/100ML-% IV SOLN
2.0000 g | Freq: Once | INTRAVENOUS | Status: AC
  Administered 2022-11-10: 2 g via INTRAVENOUS

## 2022-11-10 NOTE — ED Notes (Signed)
Bandages replaced and reinforced on right arm, and right hip. Supplies to care for wound sent with family. Discharge papers reviewed. Pharmacy verified and switched for family request. Denies questions. Able to repeat discharge instructions and verify follow up.

## 2022-11-10 NOTE — ED Notes (Addendum)
NT ambulated pt in room with no assistance. Pt had no complaints.

## 2022-11-10 NOTE — Progress Notes (Signed)
Orthopedic Tech Progress Note Patient Details:  [name] 2001-07-05 [id number] Level 1 Trauma. Not needed Patient ID: Maurice Ferguson, male   DOB: 01/17/2001, 21 y.o.   MRN: [id number]  Lovett Calender 11/10/2022, 4:32 PM
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
## 2022-11-10 NOTE — Progress Notes (Signed)
RT responded to level one trauma page. Pt on room air SpO2 100%. RT released from bedside by Dr. Janee Morn. RT will continue to be available as needed.
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
## 2022-11-11 ENCOUNTER — Other Ambulatory Visit (HOSPITAL_COMMUNITY): Payer: Self-pay

## 2022-11-11 ENCOUNTER — Encounter (HOSPITAL_COMMUNITY): Payer: Self-pay

## 2022-12-30 ENCOUNTER — Emergency Department (HOSPITAL_COMMUNITY)
Admission: EM | Admit: 2022-12-30 | Discharge: 2022-12-30 | Disposition: A | Discharge status: Home / Self Care | Payer: Medicaid Other | Attending: Emergency Medicine | Admitting: Emergency Medicine

## 2022-12-30 ENCOUNTER — Other Ambulatory Visit: Payer: Self-pay

## 2022-12-30 ENCOUNTER — Encounter (HOSPITAL_COMMUNITY): Payer: Self-pay

## 2022-12-30 ENCOUNTER — Emergency Department (HOSPITAL_COMMUNITY): Payer: Medicaid Other

## 2022-12-30 DIAGNOSIS — R531 Weakness: Secondary | ICD-10-CM | POA: Diagnosis not present

## 2022-12-30 DIAGNOSIS — J101 Influenza due to other identified influenza virus with other respiratory manifestations: Secondary | ICD-10-CM | POA: Diagnosis not present

## 2022-12-30 DIAGNOSIS — J45909 Unspecified asthma, uncomplicated: Secondary | ICD-10-CM | POA: Insufficient documentation

## 2022-12-30 DIAGNOSIS — R000 Tachycardia, unspecified: Secondary | ICD-10-CM | POA: Diagnosis not present

## 2022-12-30 DIAGNOSIS — Z1152 Encounter for screening for COVID-19: Secondary | ICD-10-CM | POA: Insufficient documentation

## 2022-12-30 DIAGNOSIS — E876 Hypokalemia: Secondary | ICD-10-CM

## 2022-12-30 DIAGNOSIS — D5700 Hb-SS disease with crisis, unspecified: Secondary | ICD-10-CM | POA: Insufficient documentation

## 2022-12-30 DIAGNOSIS — R509 Fever, unspecified: Secondary | ICD-10-CM | POA: Diagnosis present

## 2022-12-30 LAB — CBC WITH DIFFERENTIAL/PLATELET
Abs Immature Granulocytes: 0.03 10*3/uL (ref 0.00–0.07)
Basophils Absolute: 0 10*3/uL (ref 0.0–0.1)
Basophils Relative: 0 %
Eosinophils Absolute: 0 10*3/uL (ref 0.0–0.5)
Eosinophils Relative: 0 %
HCT: 40.8 % (ref 39.0–52.0)
Hemoglobin: 13.8 g/dL (ref 13.0–17.0)
Immature Granulocytes: 1 %
Lymphocytes Relative: 10 %
Lymphs Abs: 0.7 10*3/uL (ref 0.7–4.0)
MCH: 23 pg — ABNORMAL LOW (ref 26.0–34.0)
MCHC: 33.8 g/dL (ref 30.0–36.0)
MCV: 68.1 fL — ABNORMAL LOW (ref 80.0–100.0)
Monocytes Absolute: 0.7 10*3/uL (ref 0.1–1.0)
Monocytes Relative: 11 %
Neutro Abs: 5.1 10*3/uL (ref 1.7–7.7)
Neutrophils Relative %: 78 %
Platelets: 115 10*3/uL — ABNORMAL LOW (ref 150–400)
RBC: 5.99 MIL/uL — ABNORMAL HIGH (ref 4.22–5.81)
RDW: 15.8 % — ABNORMAL HIGH (ref 11.5–15.5)
WBC: 6.5 10*3/uL (ref 4.0–10.5)
nRBC: 0 % (ref 0.0–0.2)

## 2022-12-30 LAB — RETICULOCYTES
Immature Retic Fract: 9.2 % (ref 2.3–15.9)
RBC.: 5.91 MIL/uL — ABNORMAL HIGH (ref 4.22–5.81)
Retic Count, Absolute: 75.6 10*3/uL (ref 19.0–186.0)
Retic Ct Pct: 1.3 % (ref 0.4–3.1)

## 2022-12-30 LAB — COMPREHENSIVE METABOLIC PANEL
ALT: 19 U/L (ref 0–44)
AST: 21 U/L (ref 15–41)
Albumin: 4.2 g/dL (ref 3.5–5.0)
Alkaline Phosphatase: 54 U/L (ref 38–126)
Anion gap: 11 (ref 5–15)
BUN: 8 mg/dL (ref 6–20)
CO2: 24 mmol/L (ref 22–32)
Calcium: 8.6 mg/dL — ABNORMAL LOW (ref 8.9–10.3)
Chloride: 102 mmol/L (ref 98–111)
Creatinine, Ser: 0.98 mg/dL (ref 0.61–1.24)
GFR, Estimated: >60 mL/min (ref >60)
Glucose, Bld: 109 mg/dL — ABNORMAL HIGH (ref 70–99)
Potassium: 2.9 mmol/L — ABNORMAL LOW (ref 3.5–5.1)
Sodium: 137 mmol/L (ref 135–145)
Total Bilirubin: 3.4 mg/dL — ABNORMAL HIGH (ref 0.3–1.2)
Total Protein: 6.8 g/dL (ref 6.5–8.1)

## 2022-12-30 LAB — RESP PANEL BY RT-PCR (RSV, FLU A&B, COVID)  RVPGX2
Influenza A by PCR: POSITIVE — AB
Influenza B by PCR: NEGATIVE
Resp Syncytial Virus by PCR: NEGATIVE
SARS Coronavirus 2 by RT PCR: NEGATIVE

## 2022-12-30 LAB — LIPASE, BLOOD: Lipase: 35 U/L (ref 11–51)

## 2022-12-30 LAB — MAGNESIUM: Magnesium: 2 mg/dL (ref 1.7–2.4)

## 2022-12-30 MED ORDER — POTASSIUM CHLORIDE ER 10 MEQ PO TBCR: 10.0000 meq | EXTENDED_RELEASE_TABLET | Freq: Every day | ORAL | 0 refills | Status: DC

## 2022-12-30 MED ORDER — ACETAMINOPHEN 325 MG PO TABS
650.0000 mg | ORAL_TABLET | Freq: Once | ORAL | Status: AC | PRN
  Administered 2022-12-30: 650 mg via ORAL
  Filled 2022-12-30: qty 2

## 2022-12-30 MED ORDER — OSELTAMIVIR PHOSPHATE 75 MG PO CAPS: 75.0000 mg | ORAL_CAPSULE | Freq: Two times a day (BID) | ORAL | 0 refills | Status: DC

## 2022-12-30 MED ORDER — POTASSIUM CHLORIDE CRYS ER 20 MEQ PO TBCR
40.0000 meq | EXTENDED_RELEASE_TABLET | Freq: Once | ORAL | Status: AC
  Administered 2022-12-30: 40 meq via ORAL
  Filled 2022-12-30: qty 2

## 2022-12-30 MED ORDER — ONDANSETRON HCL 4 MG/2ML IJ SOLN
4.0000 mg | Freq: Once | INTRAMUSCULAR | Status: AC
  Administered 2022-12-30: 4 mg via INTRAVENOUS
  Filled 2022-12-30: qty 2

## 2022-12-30 MED ORDER — KETOROLAC TROMETHAMINE 15 MG/ML IJ SOLN
15.0000 mg | Freq: Once | INTRAMUSCULAR | Status: AC
  Administered 2022-12-30: 15 mg via INTRAVENOUS
  Filled 2022-12-30: qty 1

## 2022-12-30 MED ORDER — LACTATED RINGERS IV BOLUS
1000.0000 mL | Freq: Once | INTRAVENOUS | Status: AC
  Administered 2022-12-30: 1000 mL via INTRAVENOUS

## 2022-12-30 MED ORDER — ONDANSETRON HCL 4 MG PO TABS: 4.0000 mg | ORAL_TABLET | Freq: Four times a day (QID) | ORAL | 0 refills | Status: DC

## 2022-12-30 NOTE — ED Provider Notes (Signed)
[facility]-EMERGENCY DEPT Provider Note   CSN: [id number] Arrival date & time: 12/30/22  1702     History  Chief Complaint  Patient presents with   Fever   Emesis   Weakness   Sickle Cell Pain Crisis    Maurice Ferguson is a 22 y.o. male.  Patient is a 22 year old male with a past medical history of sickle cell anemia and asthma presenting to the emergency department with fevers and cough.  Patient states for the last 2 days he has had fever and bodyaches associated with cough and congestion.  He states he has chest pain and shortness of breath and states that the chest pain has been constant.  He states he has been using his inhalers more often recently.  He states he has nausea and vomiting but denies any abdominal pain or diarrhea.  He states others around him have been sick with similar symptoms.  He states he has not been needing more pain medication than usual and this does not feel like a sickle cell pain crisis.  The history is provided by the patient and the spouse.  Fever Associated symptoms: vomiting   Emesis Associated symptoms: fever   Weakness Associated symptoms: fever and vomiting   Sickle Cell Pain Crisis Associated symptoms: fever and vomiting        Home Medications Prior to Admission medications   Medication Sig Start Date End Date Taking? Authorizing Provider  ondansetron (ZOFRAN) 4 MG tablet Take 1 tablet (4 mg total) by mouth every 6 (six) hours. 12/30/22  Yes Elayne Snare K, DO  oseltamivir (TAMIFLU) 75 MG capsule Take 1 capsule (75 mg total) by mouth every 12 (twelve) hours. 12/30/22  Yes Theresia Lo, [place] K, DO  potassium chloride (KLOR-CON) 10 MEQ tablet Take 1 tablet (10 mEq total) by mouth daily. 12/30/22  Yes Elayne Snare K, DO  Acetaminophen (TYLENOL 8 HOUR PO) Take 1 tablet by mouth every 8 (eight) hours as needed (pain).    [provider]  folic acid (FOLVITE) 1 MG tablet Take 1 tablet (1 mg total) by mouth  daily. 07/11/22   Massie Maroon, FNP  ibuprofen (ADVIL) 800 MG tablet Take 1 tablet (800 mg total) by mouth every 8 (eight) hours as needed. 07/11/22   Massie Maroon, FNP  methocarbamol (ROBAXIN) 500 MG tablet Take 1 tablet (500 mg total) by mouth 2 (two) times daily. 11/10/22   Mardene Sayer, MD  oxyCODONE (ROXICODONE) 5 MG immediate release tablet Take 1 tablet (5 mg total) by mouth every 6 (six) hours as needed for up to 10 doses for severe pain or breakthrough pain. 11/10/22   Mardene Sayer, MD  oxyCODONE-acetaminophen (PERCOCET/ROXICET) 5-325 MG tablet Take 1 tablet by mouth every 4 (four) hours as needed for severe pain. 07/11/22   Massie Maroon, FNP      Allergies    Patient has no allergy information on record.    Review of Systems   Review of Systems  Constitutional:  Positive for fever.  Gastrointestinal:  Positive for vomiting.  Neurological:  Positive for weakness.    Physical Exam Updated Vital Signs BP 116/73   Pulse 87   Temp (!) 103 F (39.4 C) (Oral)   Resp 20   Ht 5\' 10"  (1.778 m)   Wt 54 kg   SpO2 97%   BMI 17.07 kg/m  Physical Exam Vitals and nursing note reviewed.  Constitutional:      General: He is  not in acute distress.    Appearance: Normal appearance.  HENT:     Head: Normocephalic and atraumatic.     Nose: Rhinorrhea present.     Mouth/Throat:     Mouth: Mucous membranes are dry.     Pharynx: Oropharynx is clear.  Eyes:     Extraocular Movements: Extraocular movements intact.     Conjunctiva/sclera: Conjunctivae normal.  Cardiovascular:     Rate and Rhythm: Regular rhythm. Tachycardia present.     Pulses: Normal pulses.     Heart sounds: Normal heart sounds.  Pulmonary:     Effort: Pulmonary effort is normal.     Breath sounds: Normal breath sounds. No wheezing.  Abdominal:     General: Abdomen is flat.     Palpations: Abdomen is soft.     Tenderness: There is no abdominal tenderness.  Musculoskeletal:         General: Normal range of motion.     Cervical back: Normal range of motion and neck supple.     Right lower leg: No edema.     Left lower leg: No edema.  Skin:    General: Skin is warm and dry.  Neurological:     General: No focal deficit present.     Mental Status: He is alert and oriented to person, place, and time.  Psychiatric:        Mood and Affect: Mood normal.        Behavior: Behavior normal.     ED Results / Procedures / Treatments   Labs (all labs ordered are listed, but only abnormal results are displayed) Labs Reviewed  RESP PANEL BY RT-PCR (RSV, FLU A&B, COVID)  RVPGX2 - Abnormal; Notable for the following components:      Result Value   Influenza A by PCR POSITIVE (*)    All other components within normal limits  COMPREHENSIVE METABOLIC PANEL - Abnormal; Notable for the following components:   Potassium 2.9 (*)    Glucose, Bld 109 (*)    Calcium 8.6 (*)    Total Bilirubin 3.4 (*)    All other components within normal limits  CBC WITH DIFFERENTIAL/PLATELET - Abnormal; Notable for the following components:   RBC 5.99 (*)    MCV 68.1 (*)    MCH 23.0 (*)    RDW 15.8 (*)    Platelets 115 (*)    All other components within normal limits  RETICULOCYTES - Abnormal; Notable for the following components:   RBC. 5.91 (*)    All other components within normal limits  LIPASE, BLOOD  MAGNESIUM    EKG EKG Interpretation  Date/Time:  Friday December 30 2022 19:22:53 EST Ventricular Rate:  104 PR Interval:  130 QRS Duration: 79 QT Interval:  312 QTC Calculation: 411 R Axis:   69 Text Interpretation: Sinus tachycardia Probable left atrial enlargement Abnormal T, probable ischemia, anterior leads No significant change since last tracing Confirmed by Leanord Asal ([id number]) on 12/30/2022 7:28:06 PM  Radiology DG Chest 2 View  Result Date: 12/30/2022 CLINICAL DATA:  Fever and cough EXAM: CHEST - 2 VIEW COMPARISON:  Chest x-ray November 10, 2022 FINDINGS: The  cardiomediastinal silhouette is unchanged in contour. No focal pulmonary opacity. No pleural effusion or pneumothorax. The visualized upper abdomen is unremarkable. No acute osseous abnormality. IMPRESSION: No acute cardiopulmonary abnormality. Electronically Signed   By: Beryle Flock M.D.   On: 12/30/2022 19:12    Procedures Procedures    Medications Ordered in ED Medications  acetaminophen (TYLENOL) tablet 650 mg (650 mg Oral Given 12/30/22 1802)  lactated ringers bolus 1,000 mL (0 mLs Intravenous Stopped 12/30/22 2049)  ondansetron (ZOFRAN) injection 4 mg (4 mg Intravenous Given 12/30/22 1851)  ketorolac (TORADOL) 15 MG/ML injection 15 mg (15 mg Intravenous Given 12/30/22 1853)  potassium chloride SA (KLOR-CON M) CR tablet 40 mEq (40 mEq Oral Given 12/30/22 2049)    ED Course/ Medical Decision Making/ A&P Clinical Course as of 12/30/22 2050  Fri Dec 30, 2022  2008 Total bili mildly increased form baseline, no signs of aplastic crisis. Hypokalemia that will be repleted.  [VK]  2048 Patient tested positive for the flu.  Due to his comorbidities, he will be treated with Tamiflu.  He is stable for discharge home with primary care follow-up and was given strict return precautions. [VK]    Clinical Course User Index [VK] Kemper Durie, DO                           Medical Decision Making This patient presents to the ED with chief complaint(s) of fever, cough with pertinent past medical history of SS anemia, asthma which further complicates the presenting complaint. The complaint involves an extensive differential diagnosis and also carries with it a high risk of complications and morbidity.    The differential diagnosis includes patient has no wheezing on exam making asthma exacerbation unlikely, viral syndrome, pneumonia, acute chest syndrome, pericarditis, myocarditis, dehydration, electrolyte abnormality, gastroenteritis, gastritis, GERD, hepatitis, pancreatitis, patient has no  significant increase in pain making pain crisis unlikely  Additional history obtained: Additional history obtained from family Records reviewed previous admission documents  ED Course and Reassessment: Patient was febrile and tachycardic on arrival and was given Tylenol for his fever.  Symptoms sound consistent with viral syndrome, however in the setting of his chest pain and shortness of breath and history of sickle cell, he will have labs and chest x-ray performed.  He will be given fluids and Toradol and will be closely reassessed.  Independent labs interpretation:  The following labs were independently interpreted: Influenza A positive otherwise no significant abnormalities  Independent visualization of imaging: - I independently visualized the following imaging with scope of interpretation limited to determining acute life threatening conditions related to emergency care: Chest x-ray, which revealed no acute disease  Consultation: - Consulted or discussed management/test interpretation w/ external professional: N/A  Consideration for admission or further workup: Patient has no emergent conditions requiring admission or further work-up at this time and is stable for discharge home with primary care follow-up  Social Determinants of health: N/A    Amount and/or Complexity of Data Reviewed Labs: ordered. Radiology: ordered.  Risk OTC drugs. Prescription drug management.          Final Clinical Impression(s) / ED Diagnoses Final diagnoses:  Influenza A  Hypokalemia    Rx / DC Orders ED Discharge Orders          Ordered    oseltamivir (TAMIFLU) 75 MG capsule  Every 12 hours        12/30/22 2049    ondansetron (ZOFRAN) 4 MG tablet  Every 6 hours        12/30/22 2049    potassium chloride (KLOR-CON) 10 MEQ tablet  Daily        12/30/22 2049              Kemper Durie, DO 12/30/22 2051

## 2022-12-30 NOTE — ED Triage Notes (Signed)
Patient c/o fever, chills, back pain, weakness x 3 days Patient also reports that he has sickle cell.

## 2022-12-30 NOTE — Discharge Instructions (Addendum)
You were seen in the emergency department for your fever, cough and congestion.  You tested positive for influenza A.  You were prescribed Tamiflu and should complete this as prescriped and can continue symptomatic treatment with Tylenol and Motrin as needed for fevers and bodyaches and both can be taken up to every 6 hours.  I have given you prescription for Zofran that you can take as needed for nausea.  You can try Mucinex or raw honey as needed for your cough and you can use a humidifier or hot steam from the shower to help with your congestion as well as over-the-counter and nasal decongestant sprays.  Your potassium level was also mildly low and I have given you repletion to take for the next week.  You can follow-up with your primary doctor in the next few days to have your symptoms rechecked.  You should return to the emergency department for significantly worsening shortness of breath, severe chest pain, repetitive vomiting or if you have any other new or concerning symptoms.

## 2023-01-02 ENCOUNTER — Emergency Department (HOSPITAL_COMMUNITY)
Admission: EM | Admit: 2023-01-02 | Discharge: 2023-01-03 | Disposition: A | Discharge status: Home / Self Care | Payer: Medicaid Other | Attending: Emergency Medicine | Admitting: Emergency Medicine

## 2023-01-02 ENCOUNTER — Emergency Department (HOSPITAL_COMMUNITY): Payer: Medicaid Other

## 2023-01-02 ENCOUNTER — Other Ambulatory Visit: Payer: Self-pay

## 2023-01-02 ENCOUNTER — Encounter (HOSPITAL_COMMUNITY): Payer: Self-pay

## 2023-01-02 DIAGNOSIS — F29 Unspecified psychosis not due to a substance or known physiological condition: Secondary | ICD-10-CM

## 2023-01-02 DIAGNOSIS — Z1152 Encounter for screening for COVID-19: Secondary | ICD-10-CM | POA: Insufficient documentation

## 2023-01-02 DIAGNOSIS — F1910 Other psychoactive substance abuse, uncomplicated: Secondary | ICD-10-CM | POA: Diagnosis not present

## 2023-01-02 DIAGNOSIS — J45909 Unspecified asthma, uncomplicated: Secondary | ICD-10-CM | POA: Insufficient documentation

## 2023-01-02 DIAGNOSIS — F23 Brief psychotic disorder: Secondary | ICD-10-CM | POA: Diagnosis present

## 2023-01-02 DIAGNOSIS — J101 Influenza due to other identified influenza virus with other respiratory manifestations: Secondary | ICD-10-CM | POA: Insufficient documentation

## 2023-01-02 DIAGNOSIS — Z046 Encounter for general psychiatric examination, requested by authority: Secondary | ICD-10-CM | POA: Diagnosis present

## 2023-01-02 LAB — CBC WITH DIFFERENTIAL/PLATELET
Abs Immature Granulocytes: 0.01 10*3/uL (ref 0.00–0.07)
Basophils Absolute: 0 10*3/uL (ref 0.0–0.1)
Basophils Relative: 1 %
Eosinophils Absolute: 0.1 10*3/uL (ref 0.0–0.5)
Eosinophils Relative: 2 %
HCT: 39.1 % (ref 39.0–52.0)
Hemoglobin: 13 g/dL (ref 13.0–17.0)
Immature Granulocytes: 0 %
Lymphocytes Relative: 12 %
Lymphs Abs: 0.7 10*3/uL (ref 0.7–4.0)
MCH: 22.8 pg — ABNORMAL LOW (ref 26.0–34.0)
MCHC: 33.2 g/dL (ref 30.0–36.0)
MCV: 68.6 fL — ABNORMAL LOW (ref 80.0–100.0)
Monocytes Absolute: 0.4 10*3/uL (ref 0.1–1.0)
Monocytes Relative: 7 %
Neutro Abs: 4.6 10*3/uL (ref 1.7–7.7)
Neutrophils Relative %: 78 %
Platelets: 105 10*3/uL — ABNORMAL LOW (ref 150–400)
RBC: 5.7 MIL/uL (ref 4.22–5.81)
RDW: 15.5 % (ref 11.5–15.5)
WBC: 5.9 10*3/uL (ref 4.0–10.5)
nRBC: 0 % (ref 0.0–0.2)

## 2023-01-02 LAB — SALICYLATE LEVEL: Salicylate Lvl: <7 mg/dL — ABNORMAL LOW (ref 7.0–30.0)

## 2023-01-02 LAB — RAPID URINE DRUG SCREEN, HOSP PERFORMED
Amphetamines: NOT DETECTED
Barbiturates: NOT DETECTED
Benzodiazepines: NOT DETECTED
Cocaine: NOT DETECTED
Opiates: POSITIVE — AB
Tetrahydrocannabinol: POSITIVE — AB

## 2023-01-02 LAB — COMPREHENSIVE METABOLIC PANEL
ALT: 19 U/L (ref 0–44)
AST: 16 U/L (ref 15–41)
Albumin: 4.5 g/dL (ref 3.5–5.0)
Alkaline Phosphatase: 48 U/L (ref 38–126)
Anion gap: 11 (ref 5–15)
BUN: 5 mg/dL — ABNORMAL LOW (ref 6–20)
CO2: 24 mmol/L (ref 22–32)
Calcium: 9.1 mg/dL (ref 8.9–10.3)
Chloride: 106 mmol/L (ref 98–111)
Creatinine, Ser: 0.82 mg/dL (ref 0.61–1.24)
GFR, Estimated: >60 mL/min (ref >60)
Glucose, Bld: 106 mg/dL — ABNORMAL HIGH (ref 70–99)
Potassium: 3.4 mmol/L — ABNORMAL LOW (ref 3.5–5.1)
Sodium: 141 mmol/L (ref 135–145)
Total Bilirubin: 1.9 mg/dL — ABNORMAL HIGH (ref 0.3–1.2)
Total Protein: 7.4 g/dL (ref 6.5–8.1)

## 2023-01-02 LAB — ACETAMINOPHEN LEVEL: Acetaminophen (Tylenol), Serum: <10 ug/mL — ABNORMAL LOW (ref 10–30)

## 2023-01-02 LAB — ETHANOL: Alcohol, Ethyl (B): <10 mg/dL (ref <10)

## 2023-01-02 LAB — RESP PANEL BY RT-PCR (RSV, FLU A&B, COVID)  RVPGX2
Influenza A by PCR: POSITIVE — AB
Influenza B by PCR: NEGATIVE
Resp Syncytial Virus by PCR: NEGATIVE
SARS Coronavirus 2 by RT PCR: NEGATIVE

## 2023-01-02 MED ORDER — ONDANSETRON HCL 4 MG PO TABS: 4.0000 mg | ORAL_TABLET | Freq: Three times a day (TID) | ORAL | Status: DC | PRN

## 2023-01-02 MED ORDER — OXYCODONE-ACETAMINOPHEN 5-325 MG PO TABS: 1.0000 | ORAL_TABLET | ORAL | Status: DC | PRN

## 2023-01-02 MED ORDER — LORAZEPAM 1 MG PO TABS
1.0000 mg | ORAL_TABLET | Freq: Once | ORAL | Status: AC
  Administered 2023-01-02: 1 mg via ORAL
  Filled 2023-01-02: qty 1

## 2023-01-02 MED ORDER — ONDANSETRON HCL 4 MG PO TABS: 4.0000 mg | ORAL_TABLET | Freq: Four times a day (QID) | ORAL | Status: DC | PRN

## 2023-01-02 MED ORDER — ONDANSETRON HCL 4 MG PO TABS: 4.0000 mg | ORAL_TABLET | Freq: Four times a day (QID) | ORAL | Status: DC

## 2023-01-02 MED ORDER — ACETAMINOPHEN 325 MG PO TABS: 650.0000 mg | ORAL_TABLET | ORAL | Status: DC | PRN

## 2023-01-02 MED ORDER — MELATONIN 3 MG PO TABS
3.0000 mg | ORAL_TABLET | Freq: Every day | ORAL | Status: DC
  Administered 2023-01-02: 3 mg via ORAL
  Filled 2023-01-02: qty 1

## 2023-01-02 MED ORDER — LORAZEPAM 1 MG PO TABS: 1.0000 mg | ORAL_TABLET | ORAL | Status: DC | PRN

## 2023-01-02 MED ORDER — ZIPRASIDONE MESYLATE 20 MG IM SOLR
20.0000 mg | INTRAMUSCULAR | Status: AC | PRN
  Administered 2023-01-03: 20 mg via INTRAMUSCULAR
  Filled 2023-01-02: qty 20

## 2023-01-02 MED ORDER — FOLIC ACID 1 MG PO TABS
1.0000 mg | ORAL_TABLET | Freq: Every day | ORAL | Status: DC
  Administered 2023-01-03: 1 mg via ORAL
  Filled 2023-01-02: qty 1

## 2023-01-02 MED ORDER — NICOTINE 21 MG/24HR TD PT24
21.0000 mg | MEDICATED_PATCH | Freq: Every day | TRANSDERMAL | Status: DC
  Administered 2023-01-03: 21 mg via TRANSDERMAL
  Filled 2023-01-02: qty 1

## 2023-01-02 MED ORDER — ALUM & MAG HYDROXIDE-SIMETH 200-200-20 MG/5ML PO SUSP: 30.0000 mL | Freq: Four times a day (QID) | ORAL | Status: DC | PRN

## 2023-01-02 MED ORDER — RISPERIDONE 0.5 MG PO TBDP
2.0000 mg | ORAL_TABLET | Freq: Three times a day (TID) | ORAL | Status: DC | PRN
  Administered 2023-01-03: 2 mg via ORAL
  Filled 2023-01-02: qty 4

## 2023-01-02 MED ORDER — OXYCODONE HCL 5 MG PO TABS: 5.0000 mg | ORAL_TABLET | Freq: Four times a day (QID) | ORAL | Status: DC | PRN

## 2023-01-02 MED ORDER — IBUPROFEN 800 MG PO TABS: 800.0000 mg | ORAL_TABLET | Freq: Three times a day (TID) | ORAL | Status: DC | PRN

## 2023-01-02 NOTE — ED Notes (Signed)
pt refused blood work, EKG, and swab  PA aware

## 2023-01-02 NOTE — ED Notes (Signed)
Okay shift, medication compliant, no complaints.

## 2023-01-02 NOTE — Progress Notes (Deleted)
Patient has been denied by [facility] due to no appropriate beds available. Patient meets [facility] inpatient criteria per Charmaine Downs, NP. Patient has been faxed out to the following facilities:   [facility]  [address] [place] [address] [phone] [address] [place] [address] [phone]  [facility]  [address] [place] [address] [phone] [place]  [address] [place] [address] [phone]  [facility]  [place] [address] [phone] [place]  [address] [place] [address] [phone]  [facility]  [place], [place] [address] [phone]  [facility]  [address] [place], [place] [address] [phone]  [facility]  [address] [place] [address] [place]  [facility]  [address], [place] [address] [phone]  [facility]  [address] [place]  [address] [phone] [place], MSW, LCSW-A  11:47 PM 01/02/2023
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
## 2023-01-02 NOTE — ED Notes (Signed)
Mom visiting pt at bedside at this time.

## 2023-01-02 NOTE — ED Notes (Signed)
Per PA will IVC. Once IVC paperwork complete will complete orders that patient refused.   Patient keeps walking out of room asking to use phone
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
## 2023-01-02 NOTE — Consult Note (Signed)
  Attempt to evaluate patient failed as he did not wake up to participate.  Staff states he has been sleeping since arrival and difficult to arouse.
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
## 2023-01-03 MED ORDER — STERILE WATER FOR INJECTION IJ SOLN
INTRAMUSCULAR | Status: AC
  Filled 2023-01-03: qty 10

## 2023-01-03 MED ORDER — NICOTINE 21 MG/24HR TD PT24: 21.0000 mg | MEDICATED_PATCH | Freq: Every day | TRANSDERMAL | 0 refills | Status: DC
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
## 2023-01-03 NOTE — ED Provider Notes (Addendum)
Emergency Medicine Observation Re-evaluation Note  Maurice Ferguson is a 22 y.o. male, seen on rounds today.  Pt initially presented to the ED for complaints of Psychiatric Evaluation Currently, the patient is resting.  Physical Exam  BP 116/66 (BP Location: Left Arm)   Pulse (!) 113   Temp 98.2 F (36.8 C) (Oral)   Resp 16   SpO2 95%  Physical Exam General: NAD Cardiac: Well perfused Lungs: even and unlabored Psych: no agitation  ED Course / MDM  EKG:EKG Interpretation  Date/Time:  Monday January 02 2023 12:25:09 EST Ventricular Rate:  84 PR Interval:  128 QRS Duration: 86 QT Interval:  345 QTC Calculation: 408 R Axis:   71 Text Interpretation: Sinus rhythm Probable left atrial enlargement Borderline ST elevation, anterolateral leads Confirmed by Dene Gentry [phone]) on 01/02/2023 12:31:13 PM  I have reviewed the labs performed to date as well as medications administered while in observation.  Recent changes in the last 24 hours include Pt presented to the ER yesterday concerning for acute psychosis in the setting of substance abuse.  Plan  Current plan is for Psychiatry to re-eval today.  1220 Informed by psychiatry that the patient is psychiatrically cleared. IVC reversal filled out.    Regan Lemming, MD 01/03/23 1226

## 2023-01-03 NOTE — Discharge Instructions (Signed)
Schizophrenia/Mental Health Resources ?National Suicide Prevention Lifeline ?1-800-273-TALK (8255) ?http://www.suicidepreventionlifeline.org/ ??988?-Mental Health Crisis Line ? ?National Schizophrenia Foundation ?https://sczaction.org/ ?National Institute of Mental Health ?1-866-615-6464 ?nimhinfo@nih.gov (e-mail) ?www.nimh.nih.gov ?Schizophrenia & Psychosis Action Alliance ?800-493-2094 ?info@sczaction.org ?https://sczaction.org/  ?5 Additional Healthline identified ?Best online Schizophrenia Support Groups? ?Students with Psychosis ? ?Schizophrenia Spectrum Support ? ?Supportiv ($30 monthly fee)  ? ?NAMI Connection Recovery Support Group ? ?Schizophrenia Alliance ? ?Local Resources: ?Outpatient:  ? ?[facility]:  ?([phone] ?[address]   [place], [place]  [address] ?  ? ? ?https://www.[place].gov/services/[place]-[facility]al-health-centers#contact ? ? ?[facility]  ?[facility] at [place] ?[address] ?[place], [place] [address] ?[phone] ? ?[place]  ?[facility] [place] ?[address] ?[place], [place]  [address] ?[phone] ?Local Resources ?(Inpatient) ? ?[facility] ?[address]  ?[place], [place] [address] ?[phone] ? ?[facility] ?Behavioral Medicine Unit and Geriatric Psychiatric Unit   ?[address]  ?[place], [place] [address] ?[phone] ? ? ?NAMI -[place] ?https://naminwpiedmontnc.org/support-and-education/mental-health-education/ ? ?NAMI -[place] ?https://namigu[place]ford.org/ ? ?Community Housing and Support Resources: ? ?[facility]eless[facility] ?https://pehgc.org/ ? ?Interactive Resource Center ?https://www.interactiveresourcecenter.org/ ? ?[facility] ?https://www.greensborourbanministry.org/ ? ?[facility] [place] (adult men's shelter) ?www.opendoorministrieshp.org ?[address] ?[place], [place] [address] ?336-885-0191 ?[facility] [place] [facility] ?[address] [place], [place] [address] ?[phone] ? ? ?VA's National Homeless Call Center ?1-877-4AID VET (1-877-424-3838) ? ?Veterans Crisis Line ?1-800-273-8255 press 1 ?Confidential chat-VeteransCrisisLine.net ?Or Text to 838255 ? ?United Way ?Call 211 or 1-888-892-1162 ?www.NC211.org ? ?Affordable Housing Resources in [place] ?nchousingsearch.com   ?1-877-428-8844 ? ?Shelters ? ?[place] [facility] Housing Hotline ?336-691-9521 ?8:30am-5:30pm ?[facility] ?[address] ?[place], [place]  [address] ?336-886-5594 ?Male veterans 18+ with substance abuse issues ?Eligibility:  By Referral Only ? ?[facility] ?[address] ?[place], [place] [address] ?[phone] Ext. 347 ?Adult Men & Women ?Eligibility: Valid ID & Social Security Card ?www.greensborourbanministry.org ? ?[facility] ?[address] ?[place], [place]  [address] ?336-886-5594 ?Male veterans 18+ with substance abuse issues ?Eligibility:  By Referral Only ? ?[facility] ?[address] ?[place], [place]  [address] ?336-884-1039 ?Single women 18+ without dependents ?Open 6pm-8am ?Eligibility:  Valid ID & Social Security Card ?Call to check availability  ?http://westendministries.org/leslieshouse.aspx ? ?[facility] ?[address] ?[place], [place]  [address] ?[phone] ?Male veterans 18+ with substance abuse/mental ?health issues ?Eligibility:  By Referral Only ? ?[facility] ?[address] ?[place], [place] [address] ?[phone] ?Call to check availability ?Males 18+ ?Eligibility: Valid ID & Social Security Card ?www.odm-hp.org ? ?[facility] [place] ?[address] ?[place], [place] [address] ?336-881-5400 ?Women 18+ & Families with children ?Eligibility:  Valid ID  & Criminal Background Check ?www.salvationarmycarolinas.org/commands/[place] ? ? ?[facility] Care of  (Care Management): 877-566-0943 ?[facility] [place] [facility]: Behavioral Health 24 Hour Phone Line 1-800-853-5163 ?NAMI Hotline 336-370-4264 ?NAMI [place] 919-788-0801 ? ?2-1-1 Referral Service ?United Way 211 ?Call 211 or 1-888-892-1162 ?www.NC211.org ?A free United Way, 24/7 telephone information and referral service to help link citizens who are seeking help with the community resources they need. In [place], [place], [place] and [place] counties, just dial 211 from your phone. ?Mental Health Association in [place] 336-373-1402 ?Support groups for anxiety, depression and bipolar disorder, schizophrenia,   family and friends, aftermath of suicide, and mental wellness for Latinos (in Spanish). ?Mental Health Association in [place] 336-883-7480 ?Offers support groups, [facility] program offers. Psychological, vocational, educational and other rehabilitation services to those who suffer from mental health illness of the 18 and up (5 days a week/5hours a day), offer out patient services like diagnostic evaluation, comprehensive clinical assessments, individual counseling, group therapy, psycho-educational workshops, referral to other specialists, referral to a psychiatrist for an evaluation for medication, consultation, and outreach/training. ?[facility] (Alcohol [facility] Drug Services) ?(336) [phone] ?Substance Abuse education, prevention and treatment (detox, assessments, intensive outpatient and inpatient counseling and programs). ?[facility] ?[place] (336) 370-0195, [place] (336) 883-7480 ?Support groups for posttraumatic stress, depression, and schizophrenia? as well as day programs for individuals with severe mental illness. ?[facility] [place] (336) [phone] ?[facility]-FREE-336-275-7896 ?[facility] 336-429-5600 or www.kellinfoundation.org ?Telehealth platform, Individual counseling across  the lifespan for both mental health and substance use, support groups, advocacy, case management, virtual villages, resource coordination. ?[facility]- 1-800-256-2452 ?[facility]-FREE-336-676-6840 or 1-800-853-5163 ?336-676-6849. Provides mental health services to all residents regardless of ability to pay. [address], [place]. [place]. 24 ?Domestic Violence Crisis Line ?[facility] [place] ?Call for shelter and/or safety planning ?[facility] ?336-889-7273 (24/7) ?[facility] ?336-273-7273 (24/7) ? ?VA Homeless Hotline ?877-424-3838 ? ?Veterans Crisis Line ?1-800-273-8255 press 1 ?Confidential chat-VeteransCrisisLine.net ?Or Text to 838255 ? ?[facility] [place] [facility] ?[address] ?[place], [place] [address] ?336-899-1505 ?Hours: Mon-Fri. 8am-5pm ?[facility] Mobile Crisis Management ?Mobile crisis response for mental health, substance abuse or intellectual/developmental disabilities ?1-877-626-1772 ? ? ? ? ?Disclaimer: This resource list is subject to change at any time and is a starting point for resource identification as of 12/24/2021.  ? ?

## 2023-01-03 NOTE — ED Notes (Addendum)
Pt keeps waking up, attempting to enter the other [facility] pt rooms. Pt repeatedly walking to [facility] exit door and stating "My baby is out there, someone open the doors" Pt went back into his room and came back out, looking into other [facility] patient room. Pt presents with A/V hallucinations and hyper. PRN Geodon provided.

## 2023-01-05 ENCOUNTER — Ambulatory Visit (INDEPENDENT_AMBULATORY_CARE_PROVIDER_SITE_OTHER): Payer: Medicaid Other | Admitting: Psychiatry

## 2023-01-05 ENCOUNTER — Ambulatory Visit (HOSPITAL_COMMUNITY): Payer: Medicaid Other | Admitting: Physician Assistant

## 2023-01-05 ENCOUNTER — Encounter (HOSPITAL_COMMUNITY): Payer: Self-pay

## 2023-01-05 ENCOUNTER — Encounter (HOSPITAL_COMMUNITY): Payer: Self-pay | Admitting: Psychiatry

## 2023-01-05 DIAGNOSIS — Z72820 Sleep deprivation: Secondary | ICD-10-CM | POA: Diagnosis not present

## 2023-01-05 DIAGNOSIS — F1220 Cannabis dependence, uncomplicated: Secondary | ICD-10-CM

## 2023-01-05 MED ORDER — TRAZODONE HCL 50 MG PO TABS: 50.0000 mg | ORAL_TABLET | Freq: Every day | ORAL | 3 refills | Status: DC
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
## 2023-02-01 ENCOUNTER — Telehealth (HOSPITAL_COMMUNITY): Payer: Self-pay | Admitting: Psychiatry

## 2023-02-02 NOTE — Telephone Encounter (Signed)
Patients mother notes that the patient was given an injection in the hospital. Provider informed patients mother that he was given Geodon once in the hospital and was not discharged on a long acting injectable. During his visit on 10/25 patient did not endorses symptoms of of anxiety, depression, psychosis or paranoia. He did note that he had poor sleep and Trazodone 50 mg was prescribed. Provider informed patients mother that sleep depravation as well as substance use could induce psychosis. Provider recommended patient continue Trazodone to help manage sleep and discontinue marijuana. Provider also informed patient and his mother that they can walk in to the clinic if symptoms worsen. Patient has a follow up on 03/15/2023 for medication management. No other concerns noted at this time.

## 2023-03-15 ENCOUNTER — Telehealth (HOSPITAL_COMMUNITY): Payer: Medicaid Other | Admitting: Psychiatry

## 2023-03-15 ENCOUNTER — Encounter (HOSPITAL_COMMUNITY): Payer: Self-pay

## 2023-03-15 ENCOUNTER — Other Ambulatory Visit (HOSPITAL_COMMUNITY): Payer: Self-pay | Admitting: Psychiatry

## 2023-03-15 DIAGNOSIS — Z72820 Sleep deprivation: Secondary | ICD-10-CM

## 2023-03-15 MED ORDER — TRAZODONE HCL 50 MG PO TABS: 50.0000 mg | ORAL_TABLET | Freq: Every day | ORAL | 3 refills | Status: DC

## 2023-03-15 NOTE — Telephone Encounter (Signed)
Patient's mother's numbers on file.  She informed Probation officer that she is not near her son.  She does note that he is doing well and reports that he finds trazodone ineffective but needs a refill.  Today trazodone 50 mg refilled and sent to preferred pharmacy.  Provider rescheduled patient's appointment.  No other concerns noted at this time.

## 2023-03-23 ENCOUNTER — Encounter (HOSPITAL_COMMUNITY): Payer: Self-pay

## 2023-03-23 ENCOUNTER — Emergency Department (HOSPITAL_COMMUNITY)
Admission: EM | Admit: 2023-03-23 | Discharge: 2023-03-23 | Disposition: A | Discharge status: Home / Self Care | Payer: Medicaid Other | Attending: Emergency Medicine | Admitting: Emergency Medicine

## 2023-03-23 ENCOUNTER — Other Ambulatory Visit: Payer: Self-pay

## 2023-03-23 DIAGNOSIS — T40601A Poisoning by unspecified narcotics, accidental (unintentional), initial encounter: Secondary | ICD-10-CM

## 2023-03-23 DIAGNOSIS — T402X1A Poisoning by other opioids, accidental (unintentional), initial encounter: Secondary | ICD-10-CM | POA: Diagnosis not present

## 2023-03-23 DIAGNOSIS — G8929 Other chronic pain: Secondary | ICD-10-CM | POA: Diagnosis not present

## 2023-03-23 DIAGNOSIS — R464 Slowness and poor responsiveness: Secondary | ICD-10-CM | POA: Insufficient documentation

## 2023-03-23 MED ORDER — NAPROXEN 500 MG PO TABS: 500.0000 mg | ORAL_TABLET | Freq: Two times a day (BID) | ORAL | 0 refills | Status: DC

## 2023-03-23 NOTE — ED Triage Notes (Signed)
Pt come via [facility] after taking what he thought was a percocet from off the streets, pt was agonal upon EMS arrival, oxygen at 65% on RA, assisted ventilations, given 3mg  of narcan total PTA, 2mg  IN and 1mg  IV, now AxO x 4

## 2023-03-23 NOTE — ED Notes (Signed)
Pt placed on ETCO2 monitoring
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
## 2023-03-23 NOTE — ED Provider Notes (Signed)
Clinical Course as of 03/23/23 0939  Thu Mar 23, 2023  0700 22 M accidental opioid overdose. Had percocet he bought off the street for pain control. Given Narcan in field prior to arrival (3mg  total). Reassess 8 AM.  [VB]  [id number] I reassessed the patient, pupils are still small but protecting airway satting 95 to 96% on room air.  Breathing approximately 13-17 times a minute.  Still metabolizing.  I have placed patient on end-tidal and will continue to monitor patient. [VB]  [id number] Patient reassessed, he is awake alert and clinically sober now.  He is asking me for pain medicine however I did declined giving patient any opioids as I did explain to patient that he almost died last night from opioid use.  I will give chronic pain referral and prescription for naproxen.  He is being discharged in good condition. He was observed for close to 6 hours in the ED without any further need for repeat Narcan. [VB]    Clinical Course User Index [VB] Elgie Congo, MD      Elgie Congo, MD 03/23/23 [phone]

## 2023-03-23 NOTE — Discharge Instructions (Addendum)
You took a dose of oxycodone which apparently was spiked with fentanyl.  When you buy your pills on the street, you can never be sure what is in them.  I strongly recommend that you not take any medication which you do not buy through a legitimate pharmacy.  You would benefit from being seen at a pain management center.  In the meantime, you may take ibuprofen or naproxen as needed for pain, add acetaminophen for additional pain relief.

## 2023-04-04 ENCOUNTER — Encounter (HOSPITAL_COMMUNITY): Payer: Self-pay

## 2023-04-04 ENCOUNTER — Other Ambulatory Visit: Payer: Self-pay

## 2023-04-04 ENCOUNTER — Emergency Department (HOSPITAL_COMMUNITY)
Admission: EM | Admit: 2023-04-04 | Discharge: 2023-04-04 | Disposition: A | Discharge status: Home / Self Care | Payer: Medicaid Other | Attending: Emergency Medicine | Admitting: Emergency Medicine

## 2023-04-04 DIAGNOSIS — T481X1A Poisoning by skeletal muscle relaxants [neuromuscular blocking agents], accidental (unintentional), initial encounter: Secondary | ICD-10-CM | POA: Insufficient documentation

## 2023-04-04 DIAGNOSIS — D696 Thrombocytopenia, unspecified: Secondary | ICD-10-CM | POA: Insufficient documentation

## 2023-04-04 DIAGNOSIS — T50901A Poisoning by unspecified drugs, medicaments and biological substances, accidental (unintentional), initial encounter: Secondary | ICD-10-CM

## 2023-04-04 DIAGNOSIS — T424X1A Poisoning by benzodiazepines, accidental (unintentional), initial encounter: Secondary | ICD-10-CM | POA: Diagnosis present

## 2023-04-04 LAB — COMPREHENSIVE METABOLIC PANEL
ALT: 10 U/L (ref 0–44)
AST: 18 U/L (ref 15–41)
Albumin: 4.1 g/dL (ref 3.5–5.0)
Alkaline Phosphatase: 56 U/L (ref 38–126)
Anion gap: 8 (ref 5–15)
BUN: 7 mg/dL (ref 6–20)
CO2: 26 mmol/L (ref 22–32)
Calcium: 8.7 mg/dL — ABNORMAL LOW (ref 8.9–10.3)
Chloride: 103 mmol/L (ref 98–111)
Creatinine, Ser: 1.09 mg/dL (ref 0.61–1.24)
GFR, Estimated: >60 mL/min (ref >60)
Glucose, Bld: 93 mg/dL (ref 70–99)
Potassium: 3.2 mmol/L — ABNORMAL LOW (ref 3.5–5.1)
Sodium: 137 mmol/L (ref 135–145)
Total Bilirubin: 2.2 mg/dL — ABNORMAL HIGH (ref 0.3–1.2)
Total Protein: 6.6 g/dL (ref 6.5–8.1)

## 2023-04-04 LAB — RAPID URINE DRUG SCREEN, HOSP PERFORMED
Amphetamines: NOT DETECTED
Barbiturates: NOT DETECTED
Benzodiazepines: POSITIVE — AB
Cocaine: NOT DETECTED
Opiates: NOT DETECTED
Tetrahydrocannabinol: POSITIVE — AB

## 2023-04-04 LAB — CBC WITH DIFFERENTIAL/PLATELET
Abs Immature Granulocytes: 0.01 10*3/uL (ref 0.00–0.07)
Basophils Absolute: 0 10*3/uL (ref 0.0–0.1)
Basophils Relative: 1 %
Eosinophils Absolute: 0.2 10*3/uL (ref 0.0–0.5)
Eosinophils Relative: 4 %
HCT: 32 % — ABNORMAL LOW (ref 39.0–52.0)
Hemoglobin: 10.7 g/dL — ABNORMAL LOW (ref 13.0–17.0)
Immature Granulocytes: 0 %
Lymphocytes Relative: 25 %
Lymphs Abs: 1.3 10*3/uL (ref 0.7–4.0)
MCH: 22.8 pg — ABNORMAL LOW (ref 26.0–34.0)
MCHC: 33.4 g/dL (ref 30.0–36.0)
MCV: 68.1 fL — ABNORMAL LOW (ref 80.0–100.0)
Monocytes Absolute: 0.3 10*3/uL (ref 0.1–1.0)
Monocytes Relative: 6 %
Neutro Abs: 3.4 10*3/uL (ref 1.7–7.7)
Neutrophils Relative %: 64 %
Platelets: 116 10*3/uL — ABNORMAL LOW (ref 150–400)
RBC: 4.7 MIL/uL (ref 4.22–5.81)
RDW: 14.9 % (ref 11.5–15.5)
WBC: 5.3 10*3/uL (ref 4.0–10.5)
nRBC: 0 % (ref 0.0–0.2)

## 2023-04-04 LAB — SALICYLATE LEVEL: Salicylate Lvl: <7 mg/dL — ABNORMAL LOW (ref 7.0–30.0)

## 2023-04-04 LAB — ETHANOL: Alcohol, Ethyl (B): <10 mg/dL (ref <10)

## 2023-04-04 LAB — ACETAMINOPHEN LEVEL: Acetaminophen (Tylenol), Serum: <10 ug/mL — ABNORMAL LOW (ref 10–30)

## 2023-04-04 NOTE — ED Notes (Signed)
Pt sleeping comfortably on stretcher

## 2023-04-04 NOTE — ED Notes (Signed)
Ambulated pt in hallway and patient tolerated well without any assistance
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
## 2023-04-04 NOTE — ED Provider Notes (Signed)
[place] [facility] AT [facility] Provider Note   CSN: [id number] Arrival date & time: 04/04/23  1833     History  Chief Complaint  Patient presents with   Drug Overdose    Maurice Ferguson is a 22 y.o. male.  The history is provided by the patient and medical records.  Drug Overdose   22 y.o. M presenting to the ED after OD.  Patient reportedly took 2 xanax and robaxin around midnight, went to bed and girlfriend woke up around 3am and noticed he was out of it.  He was agonal with EMS, given 1mg  IN narcan followed by 1mg  IV with good response.  Pupils were pinpoint.  Patient denies alcohol use or other co-ingestion.  No reported SI.  Home Medications Prior to Admission medications   Medication Sig Start Date End Date Taking? Authorizing Provider  diphenhydramine-acetaminophen (TYLENOL PM) 25-500 MG TABS tablet Take 1 tablet by mouth at bedtime as needed (sleep).    [provider]  folic acid (FOLVITE) 1 MG tablet Take 1 tablet (1 mg total) by mouth daily. Patient not taking: Reported on 01/02/2023 07/11/22   Massie Maroon, FNP  methocarbamol (ROBAXIN) 500 MG tablet Take 1 tablet (500 mg total) by mouth 2 (two) times daily. Patient not taking: Reported on 01/02/2023 11/10/22   Mardene Sayer, MD  naproxen (NAPROSYN) 500 MG tablet Take 1 tablet (500 mg total) by mouth 2 (two) times daily. 03/23/23   Mardene Sayer, MD  nicotine (NICODERM CQ - DOSED IN MG/24 HOURS) 21 mg/24hr patch Place 1 patch (21 mg total) onto the skin daily. 01/04/23   Earney Navy, NP  ondansetron (ZOFRAN) 4 MG tablet Take 1 tablet (4 mg total) by mouth every 6 (six) hours. Patient not taking: Reported on 01/02/2023 12/30/22   Elayne Snare K, DO  oxyCODONE (ROXICODONE) 5 MG immediate release tablet Take 1 tablet (5 mg total) by mouth every 6 (six) hours as needed for up to 10 doses for severe pain or breakthrough pain. Patient not taking: Reported on 01/02/2023  11/10/22   Mardene Sayer, MD  potassium chloride (KLOR-CON) 10 MEQ tablet Take 1 tablet (10 mEq total) by mouth daily. 12/30/22   Rexford Maus, DO  traZODone (DESYREL) 50 MG tablet Take 1 tablet (50 mg total) by mouth at bedtime. 03/15/23   Shanna Cisco, NP      Allergies    Patient has no known allergies.    Review of Systems   Review of Systems  Psychiatric/Behavioral:         OD  All other systems reviewed and are negative.   Physical Exam Updated Vital Signs BP 101/66   Pulse 78   Resp 12   Ht 5\' 10"  (1.778 m)   Wt 54.4 kg   SpO2 98%   BMI 17.22 kg/m   Physical Exam Vitals and nursing note reviewed.  Constitutional:      Appearance: He is well-developed.     Comments: Drowsy but arousable, able to walk from EMS stretcher to ER bed without issue  HENT:     Head: Normocephalic and atraumatic.  Eyes:     Conjunctiva/sclera: Conjunctivae normal.     Pupils: Pupils are equal, round, and reactive to light.  Cardiovascular:     Rate and Rhythm: Normal rate and regular rhythm.     Heart sounds: Normal heart sounds.  Pulmonary:     Effort: Pulmonary effort is normal.  Breath sounds: Normal breath sounds.  Abdominal:     General: Bowel sounds are normal.     Palpations: Abdomen is soft.  Musculoskeletal:        General: Normal range of motion.     Cervical back: Normal range of motion.  Skin:    General: Skin is warm and dry.  Neurological:     Mental Status: He is oriented to person, place, and time.     ED Results / Procedures / Treatments   Labs (all labs ordered are listed, but only abnormal results are displayed) Labs Reviewed  CBC WITH DIFFERENTIAL/PLATELET - Abnormal; Notable for the following components:      Result Value   Hemoglobin 10.7 (*)    HCT 32.0 (*)    MCV 68.1 (*)    MCH 22.8 (*)    Platelets 116 (*)    All other components within normal limits  COMPREHENSIVE METABOLIC PANEL - Abnormal; Notable for the following  components:   Potassium 3.2 (*)    Calcium 8.7 (*)    Total Bilirubin 2.2 (*)    All other components within normal limits  SALICYLATE LEVEL - Abnormal; Notable for the following components:   Salicylate Lvl <7.0 (*)    All other components within normal limits  ACETAMINOPHEN LEVEL - Abnormal; Notable for the following components:   Acetaminophen (Tylenol), Serum <10 (*)    All other components within normal limits  ETHANOL  RAPID URINE DRUG SCREEN, HOSP PERFORMED    EKG None  Radiology No results found.  Procedures Procedures    Medications Ordered in ED Medications - No data to display  ED Course/ Medical Decision Making/ A&P                             Medical Decision Making Amount and/or Complexity of Data Reviewed Labs: ordered. ECG/medicine tests: ordered and independent interpretation performed.   22 year old male presenting to the ED after overdose.  Reportedly took 2 Xanax and Robaxin before bed around midnight, girlfriend noticed he was altered around 3 AM.  Upon EMS arrival he was apneic with pinpoint pupils, did respond well to intranasal and IV Narcan.  Continues to deny any other coingestions.  He is drowsy but arousable on exam.  Able to stand off EMS stretcher and ambulate to ED stretcher independently.  His vitals are stable.  EKG is nonischemic.  Labs obtained, no significant electrolyte abnormalities.  Negative Tylenol and salicylate levels.  Ethanol is negative.  UDS is pending.  Will need to observe.  6:31 AM Patient still quite drowsy but has not required further narcan.  Will need to metabolize until awake/alert.  Care will be signed out to oncoming provider.  Anticipate discharge later this AM.  Final Clinical Impression(s) / ED Diagnoses Final diagnoses:  Accidental overdose, initial encounter  Thrombocytopenia    Rx / DC Orders ED Discharge Orders     None         Garlon Hatchet, PA-C 04/04/23 0631    Palumbo, April,  MD 04/04/23 [phone]

## 2023-04-04 NOTE — ED Provider Notes (Signed)
Accepted handoff at shift change from [employer]. Please see prior provider note for full HPI.  Briefly: Patient is a 22 y.o. male who presents to the ER for concern for overdose. Patient had reported taking two tablets of Xanax and Robaxin, then went to bed. Girlfriend woke up in the middle of the night and patient had agonal breathing. Called EMS, they gave 2 mg narcan with improvement.   DDX/Plan: Patient has not required further narcan in ER, but too intoxicated to participate in exam right now. Plan to allow for patient to metabolize, reevaluate, and hopefully d/c to home.   Physical Exam  BP 101/66   Pulse 78   Resp 12   Ht 5\' 10"  (1.778 m)   Wt 54.4 kg   SpO2 98%   BMI 17.22 kg/m   Physical Exam Vitals and nursing note reviewed.  Constitutional:      Appearance: Normal appearance.  HENT:     Head: Normocephalic and atraumatic.  Eyes:     Conjunctiva/sclera: Conjunctivae normal.  Pulmonary:     Effort: Pulmonary effort is normal. No respiratory distress.  Skin:    General: Skin is warm and dry.  Neurological:     Mental Status: He is alert.  Psychiatric:        Attention and Perception: Attention and perception normal.        Mood and Affect: Mood normal.        Speech: Speech normal.        Behavior: Behavior normal.        Thought Content: Thought content normal. Thought content does not include homicidal or suicidal ideation.    ED Course / MDM    Medical Decision Making Amount and/or Complexity of Data Reviewed Labs: ordered. ECG/medicine tests: ordered.   Rapid urine drug screen (hospital performed)  Result Value Ref Range   Opiates NONE DETECTED NONE DETECTED   Cocaine NONE DETECTED NONE DETECTED   Benzodiazepines POSITIVE (A) NONE DETECTED   Amphetamines NONE DETECTED NONE DETECTED   Tetrahydrocannabinol POSITIVE (A) NONE DETECTED   Barbiturates NONE DETECTED NONE DETECTED   Patient was able to eat and ambulate without difficulty. Patient confirms  no SI, HI, AVH. States he is ready to go home. Patient discharged in stable condition.    Jeanella Flattery 04/04/23 1042    Maia Plan, MD 04/05/23 1220

## 2023-05-31 ENCOUNTER — Telehealth (HOSPITAL_COMMUNITY): Payer: Medicaid Other | Admitting: Psychiatry

## 2023-06-06 ENCOUNTER — Telehealth (INDEPENDENT_AMBULATORY_CARE_PROVIDER_SITE_OTHER): Payer: Medicaid Other | Admitting: Psychiatry

## 2023-06-06 ENCOUNTER — Encounter (HOSPITAL_COMMUNITY): Payer: Self-pay | Admitting: Psychiatry

## 2023-06-06 DIAGNOSIS — F900 Attention-deficit hyperactivity disorder, predominantly inattentive type: Secondary | ICD-10-CM

## 2023-06-06 DIAGNOSIS — F330 Major depressive disorder, recurrent, mild: Secondary | ICD-10-CM | POA: Diagnosis not present

## 2023-06-06 MED ORDER — MIRTAZAPINE 15 MG PO TABS
15.0000 mg | ORAL_TABLET | Freq: Every day | ORAL | 3 refills | Status: DC
Start: 2023-06-06 — End: 2023-12-29

## 2023-06-06 MED ORDER — ATOMOXETINE HCL 40 MG PO CAPS
40.0000 mg | ORAL_CAPSULE | Freq: Every day | ORAL | 3 refills | Status: DC
Start: 2023-06-06 — End: 2023-12-29

## 2023-06-06 NOTE — Progress Notes (Signed)
BH MD/PA/NP OP Progress Note Virtual Visit via Video Note  I connected with Maurice Ferguson on 06/06/23 at  3:00 PM EDT by a video enabled telemedicine application and verified that I am speaking with the correct person using two identifiers.  Location: Patient: Home Provider: Clinic   I discussed the limitations of evaluation and management by telemedicine and the availability of in person appointments. The patient expressed understanding and agreed to proceed.  I provided 30 minutes of non-face-to-face time during this encounter.   06/06/2023 3:23 PM Maurice Ferguson  MRN:  [id number]  Chief Complaint: " I have been depressed and anxious"  HPI: 22 year old male seen today for follow-up psychiatric evaluation.  He has a psychiatric history of Anxiety, depression, ADHD.  Currently he is managed on trazodone 50 mg nightly. He reports that his medications is somewhat effective in managing his psychiatric conditions.   Today he is well groomed, pleasant, cooperative, and engaged in conversation. He informed Clinical research associate that he has been depressed and anxious.  Recently he notes that his pending court cases that is making him more anxious.  Patient notes that he wears a ankle monitor and goes to work and back home.  He also notes that he worries about his 83-month-year-old son and being successful.  Patient reports that he works at [employer] and often has increased anxiety while at work.  He also notes that his job performance is lacking because he is inattentive.  Patient was prescribed Ritalin in the past for ADHD however notes that it caused him to feel hyperfocused and lethargic.  Today he endorses symptoms of ADHD such as forgetfulness, distractibility, poor concentration, and inattentiveness to mentally taxing task.    Since his last visit he notes that his anxiety and depression has exacerbated.  Today provider conducted a GAD-7 and patient scored a 13, at his last visit he scored a 1.   Provider also conducted PHQ-9 and patient scored 13, at his last visit he scored a 2.  He endorses poor appetite.  Patient denies weight loss but notes that he is currently underweight.  He reports that his sleep is adequate.  Today he denies SI/HI/VAH, mania, paranoia.  Today trazodone discontinued.  Patient agreeable to starting mirtazapine 15 mg nightly to help manage anxiety, depression, sleep, and appetite.  He will also start Strattera 40 mg daily to help manage symptoms of ADHD.Potential side effects of medication and risks vs benefits of treatment vs non-treatment were explained and discussed. All questions were answered.  No other concerns noted at this time.    Visit Diagnosis:    ICD-10-CM   1. Attention deficit hyperactivity disorder (ADHD), predominantly inattentive type  F90.0 atomoxetine (STRATTERA) 40 MG capsule    2. Mild episode of recurrent major depressive disorder (HCC)  F33.0 mirtazapine (REMERON) 15 MG tablet      Past Psychiatric History: Anxiety, depression, ADHD   Past Medical History:  Past Medical History:  Diagnosis Date   Acute chest pain    ADHD (attention deficit hyperactivity disorder)    ADHD   Allergy    seasonal allergies   Asthma    Asthma    Impacted teeth    Pneumonia    Sickle cell anemia (HCC)    Vision abnormalities    wears glasses    Past Surgical History:  Procedure Laterality Date   MULTIPLE EXTRACTIONS WITH ALVEOLOPLASTY Bilateral 04/21/2017   Procedure: MULTIPLE EXTRACTIONS;  Surgeon: Ocie Doyne, DDS;  Location: [facility] OR;  Service: Oral Surgery;  Laterality: Bilateral;   NO PAST SURGERIES      Family Psychiatric History: Mother depression, anxiety, and ADHD Fathers family schizophrenia, father ADHD   Family History:  Family History  Problem Relation Age of Onset   Hypertension Maternal Grandmother    Sickle cell trait Mother    Sickle cell anemia Father     Social History:  Social History   Socioeconomic History    Marital status: Single    Spouse name: Not on file   Number of children: 1   Years of education: Not on file   Highest education level: Not on file  Occupational History   Not on file  Tobacco Use   Smoking status: Never   Smokeless tobacco: Never  Vaping Use   Vaping Use: Former  Substance and Sexual Activity   Alcohol use: Not Currently   Drug use: Yes    Types: Marijuana    Comment: Xanax   Sexual activity: Yes    Birth control/protection: None  Other Topics Concern   Not on file  Social History Narrative   ** Merged History Encounter **       Pt lives lives at home with mother, grandmother and sister. Pt is in the 7th grade.   Social Determinants of Health   Financial Resource Strain: Not on file  Food Insecurity: Not on file  Transportation Needs: Not on file  Physical Activity: Not on file  Stress: Not on file  Social Connections: Not on file    Allergies: No Known Allergies  Metabolic Disorder Labs: No results found for: "HGBA1C", "MPG" No results found for: "PROLACTIN" No results found for: "CHOL", "TRIG", "HDL", "CHOLHDL", "VLDL", "LDLCALC" No results found for: "TSH"  Therapeutic Level Labs: No results found for: "LITHIUM" No results found for: "VALPROATE" No results found for: "CBMZ"  Current Medications: Current Outpatient Medications  Medication Sig Dispense Refill   atomoxetine (STRATTERA) 40 MG capsule Take 1 capsule (40 mg total) by mouth daily. 30 capsule 3   mirtazapine (REMERON) 15 MG tablet Take 1 tablet (15 mg total) by mouth at bedtime. 30 tablet 3   diphenhydramine-acetaminophen (TYLENOL PM) 25-500 MG TABS tablet Take 1 tablet by mouth at bedtime as needed (sleep).     folic acid (FOLVITE) 1 MG tablet Take 1 tablet (1 mg total) by mouth daily. (Patient not taking: Reported on 01/02/2023) 30 tablet 2   methocarbamol (ROBAXIN) 500 MG tablet Take 1 tablet (500 mg total) by mouth 2 (two) times daily. (Patient not taking: Reported on 01/02/2023)  20 tablet 0   naproxen (NAPROSYN) 500 MG tablet Take 1 tablet (500 mg total) by mouth 2 (two) times daily. 30 tablet 0   nicotine (NICODERM CQ - DOSED IN MG/24 HOURS) 21 mg/24hr patch Place 1 patch (21 mg total) onto the skin daily. 28 patch 0   ondansetron (ZOFRAN) 4 MG tablet Take 1 tablet (4 mg total) by mouth every 6 (six) hours. (Patient not taking: Reported on 01/02/2023) 12 tablet 0   oxyCODONE (ROXICODONE) 5 MG immediate release tablet Take 1 tablet (5 mg total) by mouth every 6 (six) hours as needed for up to 10 doses for severe pain or breakthrough pain. (Patient not taking: Reported on 01/02/2023) 10 tablet 0   potassium chloride (KLOR-CON) 10 MEQ tablet Take 1 tablet (10 mEq total) by mouth daily. 7 tablet 0   No current facility-administered medications for this visit.     Musculoskeletal: Strength & Muscle Tone:  within normal limits and telehealth visit Gait & Station: normal, telehealth visit Patient leans: N/A  Psychiatric Specialty Exam: Review of Systems  There were no vitals taken for this visit.There is no height or weight on file to calculate BMI.  General Appearance: Well Groomed  Eye Contact:  Good  Speech:  Clear and Coherent and Normal Rate  Volume:  Normal  Mood:  Anxious and Depressed  Affect:  Appropriate and Congruent  Thought Process:  Coherent, Goal Directed, and Linear  Orientation:  Full (Time, Place, and Person)  Thought Content: Logical and Illogical   Suicidal Thoughts:  No  Homicidal Thoughts:  No  Memory:  Immediate;   Good Recent;   Good Remote;   Good  Judgement:  Good  Insight:  Good  Psychomotor Activity:  Normal  Concentration:  Concentration: Good and Attention Span: Good  Recall:  Good  Fund of Knowledge: Good  Language: Good  Akathisia:  No  Handed:  Right  AIMS (if indicated): not done  Assets:  Communication Skills Desire for Improvement Financial Resources/Insurance Housing Intimacy Physical Health Social  Support Talents/Skills Vocational/Educational  ADL's:  Intact  Cognition: WNL  Sleep:  Good   Screenings: GAD-7    Flowsheet Row Video Visit from 06/06/2023 in [facility]or from 01/05/2023 in [facility]  Total GAD-7 Score 13 1      PHQ2-9    Flowsheet Row Video Visit from 06/06/2023 in [facility] Counselor from 01/05/2023 in [facility]  PHQ-2 Total Score 4 0  PHQ-9 Total Score 13 2      Flowsheet Row ED from 04/04/2023 in [facility] Emergency Department at [facility] ED from 03/23/2023 in [facility] Emergency Department at [facility] Counselor from 01/05/2023 in [facility]  C-SSRS RISK CATEGORY No Risk No Risk No Risk        Assessment and Plan: Patient endorses increased anxiety, depression, reduced appetite, and symptoms of ADHD.Today trazodone discontinued.  Patient agreeable to starting mirtazapine 15 mg nightly to help manage anxiety, depression, sleep, and appetite.  He will also start Strattera 40 mg daily to help manage symptoms of ADHD.  1. Attention deficit hyperactivity disorder (ADHD), predominantly inattentive type  Start- atomoxetine (STRATTERA) 40 MG capsule; Take 1 capsule (40 mg total) by mouth daily.  Dispense: 30 capsule; Refill: 3  2. Mild episode of recurrent major depressive disorder (HCC)  Start- mirtazapine (REMERON) 15 MG tablet; Take 1 tablet (15 mg total) by mouth at bedtime.  Dispense: 30 tablet; Refill: 3   Collaboration of Care: Collaboration of Care: Other provider involved in patient's care AEB PCP  Patient/Guardian was advised Release of Information must be obtained prior to any record release in order to collaborate their care with an outside provider. Patient/Guardian was advised if they have not already done so to contact the registration department to sign all  necessary forms in order for [place] to release information regarding their care.   Consent: Patient/Guardian gives verbal consent for treatment and assignment of benefits for services provided during this visit. Patient/Guardian expressed understanding and agreed to proceed.   Follow-up in 2.5 months Shanna Cisco, NP 06/06/2023, 3:23 PM

## 2023-08-23 ENCOUNTER — Encounter (HOSPITAL_COMMUNITY): Payer: Self-pay

## 2023-08-23 ENCOUNTER — Telehealth (HOSPITAL_COMMUNITY): Payer: MEDICAID | Admitting: Psychiatry

## 2023-11-18 ENCOUNTER — Other Ambulatory Visit (HOSPITAL_COMMUNITY): Payer: Self-pay | Admitting: Psychiatry

## 2023-11-18 DIAGNOSIS — Z72820 Sleep deprivation: Secondary | ICD-10-CM

## 2023-11-25 ENCOUNTER — Other Ambulatory Visit: Payer: Self-pay

## 2023-11-25 ENCOUNTER — Emergency Department (HOSPITAL_COMMUNITY)
Admission: EM | Admit: 2023-11-25 | Discharge: 2023-11-25 | Disposition: A | Discharge status: Home / Self Care | Payer: MEDICAID | Attending: Emergency Medicine | Admitting: Emergency Medicine

## 2023-11-25 ENCOUNTER — Emergency Department (HOSPITAL_COMMUNITY): Payer: MEDICAID

## 2023-11-25 DIAGNOSIS — R109 Unspecified abdominal pain: Secondary | ICD-10-CM | POA: Diagnosis present

## 2023-11-25 DIAGNOSIS — R1084 Generalized abdominal pain: Secondary | ICD-10-CM | POA: Insufficient documentation

## 2023-11-25 DIAGNOSIS — Z1152 Encounter for screening for COVID-19: Secondary | ICD-10-CM | POA: Insufficient documentation

## 2023-11-25 DIAGNOSIS — R1033 Periumbilical pain: Secondary | ICD-10-CM | POA: Diagnosis not present

## 2023-11-25 DIAGNOSIS — B349 Viral infection, unspecified: Secondary | ICD-10-CM | POA: Insufficient documentation

## 2023-11-25 LAB — COMPREHENSIVE METABOLIC PANEL
ALT: 16 U/L (ref 0–44)
AST: 19 U/L (ref 15–41)
Albumin: 4.3 g/dL (ref 3.5–5.0)
Alkaline Phosphatase: 51 U/L (ref 38–126)
Anion gap: 9 (ref 5–15)
BUN: 7 mg/dL (ref 6–20)
CO2: 25 mmol/L (ref 22–32)
Calcium: 9.3 mg/dL (ref 8.9–10.3)
Chloride: 105 mmol/L (ref 98–111)
Creatinine, Ser: 0.82 mg/dL (ref 0.61–1.24)
GFR, Estimated: >60 mL/min (ref >60)
Glucose, Bld: 98 mg/dL (ref 70–99)
Potassium: 3.7 mmol/L (ref 3.5–5.1)
Sodium: 139 mmol/L (ref 135–145)
Total Bilirubin: 1.5 mg/dL — ABNORMAL HIGH (ref <1.2)
Total Protein: 6.6 g/dL (ref 6.5–8.1)

## 2023-11-25 LAB — CBC WITH DIFFERENTIAL/PLATELET
Abs Immature Granulocytes: 0.03 10*3/uL (ref 0.00–0.07)
Basophils Absolute: 0 10*3/uL (ref 0.0–0.1)
Basophils Relative: 0 %
Eosinophils Absolute: 0.3 10*3/uL (ref 0.0–0.5)
Eosinophils Relative: 4 %
HCT: 37.4 % — ABNORMAL LOW (ref 39.0–52.0)
Hemoglobin: 12.7 g/dL — ABNORMAL LOW (ref 13.0–17.0)
Immature Granulocytes: 0 %
Lymphocytes Relative: 13 %
Lymphs Abs: 0.9 10*3/uL (ref 0.7–4.0)
MCH: 22.8 pg — ABNORMAL LOW (ref 26.0–34.0)
MCHC: 34 g/dL (ref 30.0–36.0)
MCV: 67 fL — ABNORMAL LOW (ref 80.0–100.0)
Monocytes Absolute: 0.5 10*3/uL (ref 0.1–1.0)
Monocytes Relative: 7 %
Neutro Abs: 5.2 10*3/uL (ref 1.7–7.7)
Neutrophils Relative %: 76 %
Platelets: 168 10*3/uL (ref 150–400)
RBC: 5.58 MIL/uL (ref 4.22–5.81)
RDW: 15.4 % (ref 11.5–15.5)
Smear Review: NORMAL
WBC: 6.9 10*3/uL (ref 4.0–10.5)
nRBC: 0 % (ref 0.0–0.2)

## 2023-11-25 LAB — RETICULOCYTES
Immature Retic Fract: 13.7 % (ref 2.3–15.9)
RBC.: 5.59 MIL/uL (ref 4.22–5.81)
Retic Count, Absolute: 71 10*3/uL (ref 19.0–186.0)
Retic Ct Pct: 1.3 % (ref 0.4–3.1)

## 2023-11-25 LAB — RESP PANEL BY RT-PCR (RSV, FLU A&B, COVID)  RVPGX2
Influenza A by PCR: NEGATIVE
Influenza B by PCR: NEGATIVE
Resp Syncytial Virus by PCR: NEGATIVE
SARS Coronavirus 2 by RT PCR: NEGATIVE

## 2023-11-25 LAB — LIPASE, BLOOD: Lipase: 43 U/L (ref 11–51)

## 2023-11-25 MED ORDER — IOHEXOL 300 MG/ML  SOLN
100.0000 mL | Freq: Once | INTRAMUSCULAR | Status: AC | PRN
  Administered 2023-11-25: 75 mL via INTRAVENOUS

## 2023-11-25 MED ORDER — ONDANSETRON HCL 4 MG/2ML IJ SOLN
4.0000 mg | Freq: Once | INTRAMUSCULAR | Status: AC
  Administered 2023-11-25: 4 mg via INTRAVENOUS
  Filled 2023-11-25: qty 2

## 2023-11-25 MED ORDER — ONDANSETRON 4 MG PO TBDP: ORAL_TABLET | ORAL | 0 refills | Status: DC

## 2023-11-25 MED ORDER — MORPHINE SULFATE (PF) 4 MG/ML IV SOLN
6.0000 mg | Freq: Once | INTRAVENOUS | Status: AC
  Administered 2023-11-25: 6 mg via INTRAVENOUS
  Filled 2023-11-25: qty 2

## 2023-11-25 MED ORDER — LACTATED RINGERS IV BOLUS
1000.0000 mL | Freq: Once | INTRAVENOUS | Status: AC
  Administered 2023-11-25: 1000 mL via INTRAVENOUS

## 2023-11-25 NOTE — ED Provider Notes (Signed)
[place] EMERGENCY DEPARTMENT AT [facility] Provider Note   CSN: [id number] Arrival date & time: 11/25/23  5366     History  Chief Complaint  Patient presents with   Abdominal Pain    Maurice Ferguson is a 22 y.o. male.  Patient is a 22 year old male who has a history of sickle cell anemia who presents with cold symptoms and abdominal pain.  He has had a week history of cold symptoms including runny nose congestion and coughing.  He reports some chest pain which is related to the coughing.  He complains of shortness of breath when I ask more about this, he says it is because his nose is stuffy.  He complains of a 2-day history of abdominal pain.  He says he feels like there is a knot in his stomach.  Its mostly in his lower abdomen.  He denies any known fevers.  No urinary symptoms.  He says he has had some loose stools.  He has had prior abdominal pain related to sickle cell pain crises and he cannot tell if this is from that or something else.  He has had some nausea and vomiting.       Home Medications Prior to Admission medications   Medication Sig Start Date End Date Taking? Authorizing Provider  ondansetron (ZOFRAN-ODT) 4 MG disintegrating tablet 4mg  ODT q4 hours prn nausea/vomit 11/25/23  Yes Rolan Bucco, MD  atomoxetine (STRATTERA) 40 MG capsule Take 1 capsule (40 mg total) by mouth daily. 06/06/23   Shanna Cisco, NP  diphenhydramine-acetaminophen (TYLENOL PM) 25-500 MG TABS tablet Take 1 tablet by mouth at bedtime as needed (sleep).    [provider]  folic acid (FOLVITE) 1 MG tablet Take 1 tablet (1 mg total) by mouth daily. Patient not taking: Reported on 01/02/2023 07/11/22   Massie Maroon, FNP  methocarbamol (ROBAXIN) 500 MG tablet Take 1 tablet (500 mg total) by mouth 2 (two) times daily. Patient not taking: Reported on 01/02/2023 11/10/22   Mardene Sayer, MD  mirtazapine (REMERON) 15 MG tablet Take 1 tablet (15 mg total) by mouth at  bedtime. 06/06/23   Shanna Cisco, NP  naproxen (NAPROSYN) 500 MG tablet Take 1 tablet (500 mg total) by mouth 2 (two) times daily. 03/23/23   Mardene Sayer, MD  nicotine (NICODERM CQ - DOSED IN MG/24 HOURS) 21 mg/24hr patch Place 1 patch (21 mg total) onto the skin daily. 01/04/23   Earney Navy, NP  ondansetron (ZOFRAN) 4 MG tablet Take 1 tablet (4 mg total) by mouth every 6 (six) hours. Patient not taking: Reported on 01/02/2023 12/30/22   Elayne Snare K, DO  oxyCODONE (ROXICODONE) 5 MG immediate release tablet Take 1 tablet (5 mg total) by mouth every 6 (six) hours as needed for up to 10 doses for severe pain or breakthrough pain. Patient not taking: Reported on 01/02/2023 11/10/22   Mardene Sayer, MD  potassium chloride (KLOR-CON) 10 MEQ tablet Take 1 tablet (10 mEq total) by mouth daily. 12/30/22   Rexford Maus, DO      Allergies    Patient has no known allergies.    Review of Systems   Review of Systems  Constitutional:  Positive for fatigue. Negative for chills, diaphoresis and fever.  HENT:  Positive for congestion and rhinorrhea. Negative for sneezing.   Eyes: Negative.   Respiratory:  Positive for cough. Negative for chest tightness and shortness of breath.   Cardiovascular:  Negative  for chest pain and leg swelling.  Gastrointestinal:  Positive for abdominal pain, diarrhea, nausea and vomiting. Negative for blood in stool.  Genitourinary:  Negative for difficulty urinating, flank pain, frequency and hematuria.  Musculoskeletal:  Negative for arthralgias and back pain.  Skin:  Negative for rash.  Neurological:  Negative for dizziness, speech difficulty, weakness, numbness and headaches.    Physical Exam Updated Vital Signs BP 114/84   Pulse 66   Temp 98 F (36.7 C) (Oral)   Resp 16   Ht 5\' 9"  (1.753 m)   Wt 58.1 kg   SpO2 99%   BMI 18.90 kg/m  Physical Exam Constitutional:      Appearance: He is well-developed.  HENT:     Head:  Normocephalic and atraumatic.  Eyes:     Pupils: Pupils are equal, round, and reactive to light.  Cardiovascular:     Rate and Rhythm: Normal rate and regular rhythm.     Heart sounds: Normal heart sounds.  Pulmonary:     Effort: Pulmonary effort is normal. No respiratory distress.     Breath sounds: Normal breath sounds. No wheezing or rales.  Chest:     Chest wall: No tenderness.  Abdominal:     General: Bowel sounds are normal.     Palpations: Abdomen is soft.     Tenderness: There is generalized abdominal tenderness and tenderness in the periumbilical area. There is no guarding or rebound.  Musculoskeletal:        General: Normal range of motion.     Cervical back: Normal range of motion and neck supple.  Lymphadenopathy:     Cervical: No cervical adenopathy.  Skin:    General: Skin is warm and dry.     Findings: No rash.  Neurological:     Mental Status: He is alert and oriented to person, place, and time.     ED Results / Procedures / Treatments   Labs (all labs ordered are listed, but only abnormal results are displayed) Labs Reviewed  COMPREHENSIVE METABOLIC PANEL - Abnormal; Notable for the following components:      Result Value   Total Bilirubin 1.5 (*)    All other components within normal limits  CBC WITH DIFFERENTIAL/PLATELET - Abnormal; Notable for the following components:   Hemoglobin 12.7 (*)    HCT 37.4 (*)    MCV 67.0 (*)    MCH 22.8 (*)    All other components within normal limits  RESP PANEL BY RT-PCR (RSV, FLU A&B, COVID)  RVPGX2  LIPASE, BLOOD  RETICULOCYTES    EKG EKG Interpretation Date/Time:  Saturday November 25 2023 08:38:57 EST Ventricular Rate:  82 PR Interval:  135 QRS Duration:  76 QT Interval:  352 QTC Calculation: 412 R Axis:   62  Text Interpretation: Sinus arrhythmia since last tracing no significant change Confirmed by Rolan Bucco [phone]) on 11/25/2023 8:44:01 AM  Radiology CT ABDOMEN PELVIS W CONTRAST  Result  Date: 11/25/2023 CLINICAL DATA:  Two day history of lower abdominal pain associated with nausea and vomiting EXAM: CT ABDOMEN AND PELVIS WITH CONTRAST TECHNIQUE: Multidetector CT imaging of the abdomen and pelvis was performed using the standard protocol following bolus administration of intravenous contrast. RADIATION DOSE REDUCTION: This exam was performed according to the departmental dose-optimization program which includes automated exposure control, adjustment of the mA and/or kV according to patient size and/or use of iterative reconstruction technique. CONTRAST:  75mL OMNIPAQUE IOHEXOL 300 MG/ML  SOLN COMPARISON:  CT abdomen and  pelvis dated 11/10/2022 FINDINGS: Lower chest: No focal consolidation or pulmonary nodule in the lung bases. No pleural effusion or pneumothorax demonstrated. Partially imaged heart size is normal. Hepatobiliary: No focal hepatic lesions. No intra or extrahepatic biliary ductal dilation. Normal gallbladder. Pancreas: No focal lesions or main ductal dilation. Spleen: Normal in size without focal abnormality. Adrenals/Urinary Tract: No adrenal nodules. No suspicious renal mass, calculi or hydronephrosis. No focal bladder wall thickening. Stomach/Bowel: Normal appearance of the stomach. No evidence of bowel wall thickening, distention, or inflammatory changes. Layering fluid density within the colon. Multiple loops of fluid-filled small-bowel in the lower abdomen. The remainder of small bowel loops in the upper and mid abdomen are decompressed. Normal appendix. Vascular/Lymphatic: No significant vascular findings are present. No enlarged abdominal or pelvic lymph nodes. Reproductive: Prostate is unremarkable. Other: No free fluid, fluid collection, or free air. Musculoskeletal: No acute or abnormal lytic or blastic osseous lesions. IMPRESSION: Layering fluid density within the colon and multiple loops of fluid-filled small-bowel in the lower abdomen, which can be seen in the setting of  enterocolitis. Electronically Signed   By: Agustin Cree M.D.   On: 11/25/2023 11:35   DG Abdomen Acute W/Chest  Result Date: 11/25/2023 CLINICAL DATA:  Chest pain abdominal pain for several days. EXAM: DG ABDOMEN ACUTE WITH 1 VIEW CHEST COMPARISON:  None Available. FINDINGS: There is no evidence of dilated bowel loops or free intraperitoneal air. No radiopaque calculi or other significant radiographic abnormality is seen. Heart size and mediastinal contours are within normal limits. Both lungs are clear. IMPRESSION: Negative abdominal radiographs.  No acute cardiopulmonary disease. Electronically Signed   By: Genevive Bi M.D.   On: 11/25/2023 10:01    Procedures Procedures    Medications Ordered in ED Medications  lactated ringers bolus 1,000 mL (0 mLs Intravenous Stopped 11/25/23 0942)  morphine (PF) 4 MG/ML injection 6 mg (6 mg Intravenous Given 11/25/23 0839)  ondansetron (ZOFRAN) injection 4 mg (4 mg Intravenous Given 11/25/23 1004)  iohexol (OMNIPAQUE) 300 MG/ML solution 100 mL (75 mLs Intravenous Contrast Given 11/25/23 1118)    ED Course/ Medical Decision Making/ A&P                                 Medical Decision Making Amount and/or Complexity of Data Reviewed Labs: ordered. Radiology: ordered.  Risk Prescription drug management.   Patient is 22year-old male who has a history of sickle cell anemia who presents with some viral type symptoms with runny nose congestion and coughing.  He also has abdominal pain.  He has had some vomiting and some loose stools.  Initially had labs which are unremarkable.  There is no concern for aplastic crisis.  It does not really appear that he is having a sickle cell pain crises.  Chest x-ray does not show any evidence of pneumonia or acute chest syndrome.  This was interpreted by me and confirmed by the radiologist.  He initially had acute abdominal series which does not show any evidence of obstruction but he then had ongoing vomiting so a  CT was performed which shows some evidence of enteritis.  No other acute abnormalities.  He was given IV fluids and antiemetics.  He is feeling much better.  He is able to tolerate oral fluids and a sandwich without any vomiting or increased pain.  He was discharged home in good condition.  Return precautions were given.  Final Clinical Impression(s) /  ED Diagnoses Final diagnoses:  Generalized abdominal pain  Viral syndrome    Rx / DC Orders ED Discharge Orders          Ordered    ondansetron (ZOFRAN-ODT) 4 MG disintegrating tablet        11/25/23 1405              Rolan Bucco, MD 11/25/23 1420

## 2023-11-25 NOTE — ED Notes (Signed)
Family came out stating patient was vomiting.

## 2023-11-25 NOTE — ED Triage Notes (Signed)
Patient with lower abdominal pain x 2 days. Endorses n/v but no diarrhea. Also states congestion and cough for several days.

## 2023-12-27 ENCOUNTER — Other Ambulatory Visit: Payer: Self-pay

## 2023-12-27 ENCOUNTER — Encounter (HOSPITAL_COMMUNITY): Payer: Self-pay | Admitting: Internal Medicine

## 2023-12-27 ENCOUNTER — Inpatient Hospital Stay (HOSPITAL_COMMUNITY)
Admission: EM | Admit: 2023-12-27 | Discharge: 2023-12-29 | DRG: 812 | Disposition: A | Discharge status: Home / Self Care | Payer: MEDICAID | Attending: Internal Medicine | Admitting: Internal Medicine

## 2023-12-27 ENCOUNTER — Observation Stay (HOSPITAL_COMMUNITY): Payer: MEDICAID

## 2023-12-27 DIAGNOSIS — G479 Sleep disorder, unspecified: Secondary | ICD-10-CM | POA: Insufficient documentation

## 2023-12-27 DIAGNOSIS — Z8249 Family history of ischemic heart disease and other diseases of the circulatory system: Secondary | ICD-10-CM

## 2023-12-27 DIAGNOSIS — D5700 Hb-SS disease with crisis, unspecified: Secondary | ICD-10-CM | POA: Diagnosis not present

## 2023-12-27 DIAGNOSIS — Z832 Family history of diseases of the blood and blood-forming organs and certain disorders involving the immune mechanism: Secondary | ICD-10-CM

## 2023-12-27 DIAGNOSIS — Z79899 Other long term (current) drug therapy: Secondary | ICD-10-CM

## 2023-12-27 DIAGNOSIS — G4700 Insomnia, unspecified: Secondary | ICD-10-CM | POA: Diagnosis present

## 2023-12-27 DIAGNOSIS — Z8701 Personal history of pneumonia (recurrent): Secondary | ICD-10-CM

## 2023-12-27 DIAGNOSIS — J45909 Unspecified asthma, uncomplicated: Secondary | ICD-10-CM | POA: Diagnosis present

## 2023-12-27 LAB — CBC WITH DIFFERENTIAL/PLATELET
Abs Immature Granulocytes: 0 10*3/uL (ref 0.00–0.07)
Basophils Absolute: 0.1 10*3/uL (ref 0.0–0.1)
Basophils Relative: 1 %
Eosinophils Absolute: 0.1 10*3/uL (ref 0.0–0.5)
Eosinophils Relative: 1 %
HCT: 39.7 % (ref 39.0–52.0)
Hemoglobin: 13.7 g/dL (ref 13.0–17.0)
Lymphocytes Relative: 17 %
Lymphs Abs: 2.1 10*3/uL (ref 0.7–4.0)
MCH: 23.6 pg — ABNORMAL LOW (ref 26.0–34.0)
MCHC: 34.5 g/dL (ref 30.0–36.0)
MCV: 68.3 fL — ABNORMAL LOW (ref 80.0–100.0)
Monocytes Absolute: 0.2 10*3/uL (ref 0.1–1.0)
Monocytes Relative: 2 %
Neutro Abs: 9.8 10*3/uL — ABNORMAL HIGH (ref 1.7–7.7)
Neutrophils Relative %: 79 %
Platelets: 173 10*3/uL (ref 150–400)
RBC: 5.81 MIL/uL (ref 4.22–5.81)
RDW: 15.6 % — ABNORMAL HIGH (ref 11.5–15.5)
WBC: 12.4 10*3/uL — ABNORMAL HIGH (ref 4.0–10.5)
nRBC: 0 /100{WBCs}
nRBC: 0.2 % (ref 0.0–0.2)

## 2023-12-27 LAB — COMPREHENSIVE METABOLIC PANEL
ALT: 64 U/L — ABNORMAL HIGH (ref 0–44)
AST: 49 U/L — ABNORMAL HIGH (ref 15–41)
Albumin: 4.8 g/dL (ref 3.5–5.0)
Alkaline Phosphatase: 63 U/L (ref 38–126)
Anion gap: 13 (ref 5–15)
BUN: 7 mg/dL (ref 6–20)
CO2: 22 mmol/L (ref 22–32)
Calcium: 9.7 mg/dL (ref 8.9–10.3)
Chloride: 105 mmol/L (ref 98–111)
Creatinine, Ser: 0.79 mg/dL (ref 0.61–1.24)
GFR, Estimated: >60 mL/min (ref >60)
Glucose, Bld: 103 mg/dL — ABNORMAL HIGH (ref 70–99)
Potassium: 3.7 mmol/L (ref 3.5–5.1)
Sodium: 140 mmol/L (ref 135–145)
Total Bilirubin: 2.2 mg/dL — ABNORMAL HIGH (ref 0.0–1.2)
Total Protein: 7.2 g/dL (ref 6.5–8.1)

## 2023-12-27 LAB — RETICULOCYTES
Immature Retic Fract: 12.7 % (ref 2.3–15.9)
RBC.: 5.87 MIL/uL — ABNORMAL HIGH (ref 4.22–5.81)
Retic Count, Absolute: 82.2 10*3/uL (ref 19.0–186.0)
Retic Ct Pct: 1.4 % (ref 0.4–3.1)

## 2023-12-27 MED ORDER — SODIUM CHLORIDE 0.9% FLUSH: 9.0000 mL | INTRAVENOUS | Status: DC | PRN

## 2023-12-27 MED ORDER — SENNA 8.6 MG PO TABS
1.0000 | ORAL_TABLET | Freq: Every day | ORAL | Status: DC
Start: 2023-12-27 — End: 2023-12-29
  Administered 2023-12-27 – 2023-12-29 (×3): 8.6 mg via ORAL
  Filled 2023-12-27 (×3): qty 1

## 2023-12-27 MED ORDER — SODIUM CHLORIDE 0.9% FLUSH
3.0000 mL | Freq: Two times a day (BID) | INTRAVENOUS | Status: DC
  Administered 2023-12-27 – 2023-12-29 (×4): 3 mL via INTRAVENOUS

## 2023-12-27 MED ORDER — HYDROMORPHONE HCL 1 MG/ML IJ SOLN
2.0000 mg | INTRAMUSCULAR | Status: AC | PRN
  Administered 2023-12-27 (×2): 2 mg via INTRAVENOUS
  Filled 2023-12-27 (×2): qty 2

## 2023-12-27 MED ORDER — POLYETHYLENE GLYCOL 3350 17 G PO PACK
17.0000 g | PACK | Freq: Every day | ORAL | Status: DC
  Administered 2023-12-27 – 2023-12-29 (×3): 17 g via ORAL
  Filled 2023-12-27 (×3): qty 1

## 2023-12-27 MED ORDER — DIPHENHYDRAMINE HCL 12.5 MG/5ML PO ELIX: 12.5000 mg | ORAL_SOLUTION | Freq: Four times a day (QID) | ORAL | Status: DC | PRN

## 2023-12-27 MED ORDER — DEXTROSE-SODIUM CHLORIDE 5-0.45 % IV SOLN: INTRAVENOUS | Status: DC

## 2023-12-27 MED ORDER — MIRTAZAPINE 15 MG PO TABS
15.0000 mg | ORAL_TABLET | Freq: Every day | ORAL | Status: DC
  Administered 2023-12-27 – 2023-12-28 (×2): 15 mg via ORAL
  Filled 2023-12-27 (×2): qty 1

## 2023-12-27 MED ORDER — FOLIC ACID 1 MG PO TABS
1.0000 mg | ORAL_TABLET | Freq: Every day | ORAL | Status: DC
Start: 2023-12-27 — End: 2023-12-29
  Administered 2023-12-27 – 2023-12-29 (×3): 1 mg via ORAL
  Filled 2023-12-27 (×3): qty 1

## 2023-12-27 MED ORDER — NALOXONE HCL 0.4 MG/ML IJ SOLN: 0.4000 mg | INTRAMUSCULAR | Status: DC | PRN

## 2023-12-27 MED ORDER — HYDROMORPHONE HCL 1 MG/ML IJ SOLN
1.0000 mg | Freq: Once | INTRAMUSCULAR | Status: AC
  Administered 2023-12-27: 1 mg via SUBCUTANEOUS
  Filled 2023-12-27: qty 1

## 2023-12-27 MED ORDER — HYDROMORPHONE HCL 1 MG/ML IJ SOLN
2.0000 mg | INTRAMUSCULAR | Status: AC
  Administered 2023-12-27: 2 mg via INTRAVENOUS
  Filled 2023-12-27: qty 2

## 2023-12-27 MED ORDER — HYDROMORPHONE 1 MG/ML IV SOLN
INTRAVENOUS | Status: DC
  Administered 2023-12-27: 30 mg via INTRAVENOUS
  Administered 2023-12-27: 0.5 mg via INTRAVENOUS
  Administered 2023-12-28: 1.5 mg via INTRAVENOUS
  Administered 2023-12-28: 2.83 mg via INTRAVENOUS
  Administered 2023-12-28: 2.4 mg via INTRAVENOUS
  Administered 2023-12-28: 1.5 mg via INTRAVENOUS
  Administered 2023-12-28: 0.9 mg via INTRAVENOUS
  Administered 2023-12-28: 1.8 mg via INTRAVENOUS
  Administered 2023-12-29: 0.6 mg via INTRAVENOUS
  Administered 2023-12-29: 1.5 mg via INTRAVENOUS
  Administered 2023-12-29: 0.9 mg via INTRAVENOUS
  Filled 2023-12-27: qty 30

## 2023-12-27 MED ORDER — RIVAROXABAN 10 MG PO TABS
10.0000 mg | ORAL_TABLET | Freq: Every day | ORAL | Status: DC
  Administered 2023-12-27 – 2023-12-29 (×3): 10 mg via ORAL
  Filled 2023-12-27 (×3): qty 1

## 2023-12-27 MED ORDER — DIPHENHYDRAMINE HCL 50 MG/ML IJ SOLN: 12.5000 mg | Freq: Four times a day (QID) | INTRAMUSCULAR | Status: DC | PRN

## 2023-12-27 MED ORDER — ONDANSETRON HCL 4 MG/2ML IJ SOLN
4.0000 mg | Freq: Four times a day (QID) | INTRAMUSCULAR | Status: DC | PRN
  Administered 2023-12-27: 4 mg via INTRAVENOUS
  Filled 2023-12-27: qty 2

## 2023-12-27 MED ORDER — KETOROLAC TROMETHAMINE 15 MG/ML IJ SOLN
15.0000 mg | Freq: Four times a day (QID) | INTRAMUSCULAR | Status: DC
  Administered 2023-12-27 – 2023-12-29 (×8): 15 mg via INTRAVENOUS
  Filled 2023-12-27 (×8): qty 1

## 2023-12-27 NOTE — Plan of Care (Signed)
  Problem: Health Behavior/Discharge Planning: Goal: Ability to manage health-related needs will improve Outcome: Progressing   Problem: Clinical Measurements: Goal: Ability to maintain clinical measurements within normal limits will improve Outcome: Progressing  Problem: Clinical Measurements: Goal: Respiratory complications will improve Outcome: Progressing   Problem: Pain Management: Goal: General experience of comfort will improve Outcome: Progressing

## 2023-12-27 NOTE — ED Notes (Signed)
 [phone] Simpson update Mother

## 2023-12-27 NOTE — Hospital Course (Addendum)
 Sickle cell pain crisis History of sickle cell disease He has known sickle cell disease and presented in a sickle cell pain crisis, with pain predominantly in his lower back. He has had pain crises in the past, but his last one was over two years ago. He was started on a PCA pump and IV fluids on admission to the hospital, and his pain improved over the course of two days. He was transitioned off the PCA pump and started on a short course of oral pain medications with outpatient follow up. He used to follow with [facility], but has not seen them in a while, as he currently lives in [place] and has difficulty traveling to [place]. We will schedule him a follow up appointment with [facility] in [place], [place].

## 2023-12-27 NOTE — ED Notes (Signed)
 Pts male friend at bedside shouting yelling and very disrespectful cursing about the pts care and how she thinks the pts care should go  pt apparently moved into this section minutes prior to my arrival on duty  security was called per charge about her behavior   they allowed her to stay at the bedside  bed ready upstairs  transported there

## 2023-12-27 NOTE — ED Provider Notes (Signed)
 [place] EMERGENCY DEPARTMENT AT [facility] Provider Note   CSN: [id number] Arrival date & time: 12/27/23  9180     History  Chief Complaint  Patient presents with   Sickle Cell Pain Crisis    Maurice Ferguson is a 23 y.o. male with sickle cell presents with acute low back pain stating he has in a pain crisis that started this morning.  Reports that this is the typical location for him since he broke his low back years ago.  No surgery.  Denies any numbness or tingling in his lower extremities.  Denies any chest pain, cough, abdominal pain, fevers.  States that the only thing that is bothering him is his low back.  No IV drug use.    Sickle Cell Pain Crisis      Home Medications Prior to Admission medications   Medication Sig Start Date End Date Taking? Authorizing Provider  atomoxetine  (STRATTERA ) 40 MG capsule Take 1 capsule (40 mg total) by mouth daily. 06/06/23   Harl Zane BRAVO, NP  diphenhydramine -acetaminophen  (TYLENOL  PM) 25-500 MG TABS tablet Take 1 tablet by mouth at bedtime as needed (sleep).    [provider]  folic acid  (FOLVITE ) 1 MG tablet Take 1 tablet (1 mg total) by mouth daily. Patient not taking: Reported on 01/02/2023 07/11/22   Tilford Bertram HERO, FNP  methocarbamol  (ROBAXIN ) 500 MG tablet Take 1 tablet (500 mg total) by mouth 2 (two) times daily. Patient not taking: Reported on 01/02/2023 11/10/22   Ethyl Richerd BROCKS, MD  mirtazapine  (REMERON ) 15 MG tablet Take 1 tablet (15 mg total) by mouth at bedtime. 06/06/23   Harl Zane BRAVO, NP  naproxen  (NAPROSYN ) 500 MG tablet Take 1 tablet (500 mg total) by mouth 2 (two) times daily. 03/23/23   Ethyl Richerd BROCKS, MD  nicotine  (NICODERM CQ  - DOSED IN MG/24 HOURS) 21 mg/24hr patch Place 1 patch (21 mg total) onto the skin daily. 01/04/23   Onuoha, Josephine C, NP  ondansetron  (ZOFRAN ) 4 MG tablet Take 1 tablet (4 mg total) by mouth every 6 (six) hours. Patient not taking: Reported on 01/02/2023  12/30/22   Kingsley, Victoria K, DO  ondansetron  (ZOFRAN -ODT) 4 MG disintegrating tablet 4mg  ODT q4 hours prn nausea/vomit 11/25/23   Lenor Hollering, MD  oxyCODONE  (ROXICODONE ) 5 MG immediate release tablet Take 1 tablet (5 mg total) by mouth every 6 (six) hours as needed for up to 10 doses for severe pain or breakthrough pain. Patient not taking: Reported on 01/02/2023 11/10/22   Ethyl Richerd BROCKS, MD  potassium chloride  (KLOR-CON ) 10 MEQ tablet Take 1 tablet (10 mEq total) by mouth daily. 12/30/22   Kingsley, Victoria K, DO      Allergies    Patient has no known allergies.    Review of Systems   Review of Systems  Musculoskeletal:  Positive for arthralgias and myalgias.    Physical Exam Updated Vital Signs BP (!) 129/103   Pulse (!) 111   Temp (!) 97.5 F (36.4 C) (Oral)   Resp 16   Ht 5' 10 (1.778 m)   Wt 57.2 kg   SpO2 97%   BMI 18.08 kg/m  Physical Exam Vitals and nursing note reviewed.  Constitutional:      General: He is in acute distress.     Appearance: He is well-developed.  HENT:     Head: Normocephalic and atraumatic.  Eyes:     Conjunctiva/sclera: Conjunctivae normal.  Cardiovascular:     Rate and  Rhythm: Normal rate and regular rhythm.     Heart sounds: No murmur heard. Pulmonary:     Effort: Pulmonary effort is normal. No respiratory distress.     Breath sounds: Normal breath sounds.  Abdominal:     Palpations: Abdomen is soft.     Tenderness: There is no abdominal tenderness. There is no guarding or rebound.  Musculoskeletal:        General: No swelling.     Cervical back: Neck supple.     Comments: Lumbar midline and paraspinal tenderness bilaterally.  No step  Skin:    General: Skin is warm and dry.     Capillary Refill: Capillary refill takes less than 2 seconds.  Neurological:     Mental Status: He is alert.  Psychiatric:        Mood and Affect: Mood normal.     ED Results / Procedures / Treatments   Labs (all labs ordered are listed, but  only abnormal results are displayed) Labs Reviewed  COMPREHENSIVE METABOLIC PANEL - Abnormal; Notable for the following components:      Result Value   Glucose, Bld 103 (*)    AST 49 (*)    ALT 64 (*)    Total Bilirubin 2.2 (*)    All other components within normal limits  CBC WITH DIFFERENTIAL/PLATELET - Abnormal; Notable for the following components:   WBC 12.4 (*)    MCV 68.3 (*)    MCH 23.6 (*)    RDW 15.6 (*)    Neutro Abs 9.8 (*)    All other components within normal limits  RETICULOCYTES - Abnormal; Notable for the following components:   RBC. 5.87 (*)    All other components within normal limits  HIV ANTIBODY (ROUTINE TESTING W REFLEX)  LACTATE DEHYDROGENASE    EKG None  Radiology No results found.  Procedures Procedures    Medications Ordered in ED Medications  HYDROmorphone  (DILAUDID ) injection 2 mg (has no administration in time range)  rivaroxaban  (XARELTO ) tablet 10 mg (has no administration in time range)  sodium chloride  flush (NS) 0.9 % injection 3 mL (has no administration in time range)  naloxone  (NARCAN ) injection 0.4 mg (has no administration in time range)    And  sodium chloride  flush (NS) 0.9 % injection 9 mL (has no administration in time range)  ondansetron  (ZOFRAN ) injection 4 mg (has no administration in time range)  diphenhydrAMINE  (BENADRYL ) injection 12.5 mg (has no administration in time range)    Or  diphenhydrAMINE  (BENADRYL ) 12.5 MG/5ML elixir 12.5 mg (has no administration in time range)  HYDROmorphone  (DILAUDID ) 1 mg/mL PCA injection (has no administration in time range)  polyethylene glycol (MIRALAX  / GLYCOLAX ) packet 17 g (has no administration in time range)  senna (SENOKOT) tablet 8.6 mg (has no administration in time range)  dextrose  5 % and 0.45 % NaCl infusion (has no administration in time range)  HYDROmorphone  (DILAUDID ) injection 1 mg (1 mg Subcutaneous Given 12/27/23 0849)  HYDROmorphone  (DILAUDID ) injection 2 mg (2 mg  Intravenous Given 12/27/23 0953)  HYDROmorphone  (DILAUDID ) injection 2 mg (2 mg Intravenous Given 12/27/23 1029)  HYDROmorphone  (DILAUDID ) injection 2 mg (2 mg Intravenous Given 12/27/23 1114)    ED Course/ Medical Decision Making/ A&P                                 Medical Decision Making Amount and/or Complexity of Data Reviewed Labs: ordered.  Risk Prescription drug management.   This patient presents to the ED with chief complaint(s) of pain crisis.  The complaint involves an extensive differential diagnosis and also carries with it a high risk of complications and morbidity.   pertinent past medical history as listed in HPI  The differential diagnosis includes  Pain crisis, muscle strain, fracture, abscess The initial plan is to  Start with basic labs, sickle cell pain crisis pain control Additional history obtained: Additional history obtained from significant other Records reviewed previous admission documents  Initial Assessment:   Nontoxic appearing patient presenting hypertensive and tachycardic in the setting of of sickle cell pain crisis localized to lumbar spine.  Has history of similar symptoms with unremarkable imaging.  Not currently using drugs, but does have a history of substance abuse.  No IV drug use, afebrile.  No suspicion for spinal abscess.  No chest pain, cough, abdominal pain, overall no evidence of acute chest syndrome, splenic sequestration, or vaso-occlusive.   Independent ECG interpretation:  Sinus tachycardia  Independent labs interpretation:  The following labs were independently interpreted:  CBC with mild leukocytosis of 12.4, CMP with mild transaminitis and increased total bili although within baseline, reticulocyte count within normal limits  Independent visualization and interpretation of imaging: none  Treatment and Reassessment: Patient given multiple doses of Dilaudid  without any improvement of symptoms.  Discussed admission with patient.   He is agreeable.   Consultations obtained:   Internal medicine/unassigned-received return call from Dr. Ozell, agreeable for admission  Disposition:   Patient will be admitted for uncontrolled pain in the setting of sickle cell pain crisis.  He is agreeable.  Social Determinants of Health:   Patient's impaired access to primary care  increases the complexity of managing their presentation  This note was dictated with voice recognition software.  Despite best efforts at proofreading, errors may have occurred which can change the documentation meaning.          Final Clinical Impression(s) / ED Diagnoses Final diagnoses:  Sickle cell pain crisis [facility])    Rx / DC Orders ED Discharge Orders     None         Donnajean Lynwood DEL, PA-C 12/27/23 1427    Emil Share, DO 12/27/23 1456

## 2023-12-27 NOTE — Progress Notes (Signed)
 Pt refused labs at this time. Patient states lab can come at 1830 to draw labs so he can get some rest.

## 2023-12-27 NOTE — ED Triage Notes (Signed)
 Pt arrives with sickle cell pain crisis. Pain in lower back since last night. No meds PTA.

## 2023-12-27 NOTE — H&P (Signed)
 Date: 12/27/2023               Patient Name:  Maurice Ferguson MRN: [id number]  DOB: 12-05-2001 Age / Sex: 23 y.o., male   PCP: Patient, No Pcp Per         Medical Service: Internal Medicine Teaching Service         Attending Physician: Dr. Rosan Dayton BROCKS, DO      First Contact: Dr. Ozell Riff, MD Pager [phone]    Second Contact: Dr. Ozell Kung, MD Pager [phone]         After Hours (After 5p/  First Contact Pager: [phone]  weekends / holidays): Second Contact Pager: [phone]   SUBJECTIVE   Chief Complaint: sickle cell crisis  History of Present Illness:  Maurice Ferguson is a 23 year old with a PMH of sickle cell disease and asthma who presents with acute severe lower back pain, which is consistent with prior sickle cell episodes. He says the pain started around 3-4 a.m this morning and has progressively worsened. He states this feel like similar pain crises he has had. He threw up once, but denies coughing or wheezing. He does report some shortness of breath. He denies any recent illness or substance use. He took tylenol  and ibuprofen  at home, but it didn't help. He has received Dilaudid  in the ED, which he says only briefly helped his pain. He does not have a primary care doctor and usually goes to the urgent care for pain medicine when he has a pain crisis. He used to see a sickle cell doctor at [facility] but stopped going because it was so far away, would like to reestablish.   Meds:  Mirtazapine  15 mg for sleep Tylenol  Ibuprofen  Albuterol  inhaler (?)  Past Medical History: Past Medical History:  Diagnosis Date   Acute chest pain    ADHD (attention deficit hyperactivity disorder)    ADHD   Allergy    seasonal allergies   Asthma    Asthma    Impacted teeth    Pneumonia    Sickle cell anemia (HCC)    Vision abnormalities    wears glasses    Past surgical history: Past Surgical History:  Procedure Laterality Date   MULTIPLE EXTRACTIONS WITH ALVEOLOPLASTY Bilateral  04/21/2017   Procedure: MULTIPLE EXTRACTIONS;  Surgeon: Glendia Primrose, DDS;  Location: [facility] OR;  Service: Oral Surgery;  Laterality: Bilateral;   NO PAST SURGERIES     Social:  Lives With: girlfriend Support: girlfriend Level of Function: independent in all PCP: None Substances: Occasional alcohol. Occasional black and mild, no regular tobacco use.  Family History:  Family History  Problem Relation Age of Onset   Hypertension Maternal Grandmother    Sickle cell trait Mother    Sickle cell anemia Father    Allergies: Allergies as of 12/27/2023   (No Known Allergies)   Review of Systems: A complete ROS was negative except as per HPI.   OBJECTIVE:   Physical Exam: Blood pressure (!) 129/103, pulse (!) 111, temperature (!) 97.5 F (36.4 C), temperature source Oral, resp. rate 16, height 5' 10 (1.778 m), weight 57.2 kg, SpO2 97%.  Constitutional: sitting up in bed, torso tensing in pain, holding lower back HENT: mucous membranes moist Cardiovascular: tachycardic, normal rhythm, no m/r/g Pulmonary/Chest: CTA bilaterally, mild tachypnea, normal WOB Abdominal: soft, non-tender, non-distended MSK: normal bulk and tone; tenderness to palpation over lower back, midline and paraspinal muscles, extending towards sacrum Neurological: alert & oriented x 3,  no focal deficites Skin: warm and dry  Labs: CBC    Component Value Date/Time   WBC 12.4 (H) 12/27/2023 0843   RBC 5.81 12/27/2023 0843   RBC 5.87 (H) 12/27/2023 0843   HGB 13.7 12/27/2023 0843   HCT 39.7 12/27/2023 0843   PLT 173 12/27/2023 0843   MCV 68.3 (L) 12/27/2023 0843   MCH 23.6 (L) 12/27/2023 0843   MCHC 34.5 12/27/2023 0843   RDW 15.6 (H) 12/27/2023 0843   LYMPHSABS 2.1 12/27/2023 0843   MONOABS 0.2 12/27/2023 0843   EOSABS 0.1 12/27/2023 0843   BASOSABS 0.1 12/27/2023 0843     CMP     Component Value Date/Time   NA 140 12/27/2023 0843   K 3.7 12/27/2023 0843   CL 105 12/27/2023 0843   CO2 22 12/27/2023  0843   GLUCOSE 103 (H) 12/27/2023 0843   BUN 7 12/27/2023 0843   CREATININE 0.79 12/27/2023 0843   CALCIUM 9.7 12/27/2023 0843   PROT 7.2 12/27/2023 0843   ALBUMIN 4.8 12/27/2023 0843   AST 49 (H) 12/27/2023 0843   ALT 64 (H) 12/27/2023 0843   ALKPHOS 63 12/27/2023 0843   BILITOT 2.2 (H) 12/27/2023 0843   GFRNONAA >60 12/27/2023 0843   GFRAA NOT CALCULATED 12/02/2018 2308   EKG: personally reviewed my interpretation is sinus tachycardia  ASSESSMENT & PLAN:   Assessment & Plan by Problem: Principal Problem:   Sickle cell crisis (HCC) Active Problems:   Sleep trouble  Maurice Ferguson is a 23 y.o. person living with a history of sickle cell disease and asthma who presented with acute severe lower back pain and is admitted for sickle cell pain crisis on hospital day 0  Sickle cell pain crisis History of sickle cell disease He is in a sickle cell crisis, but it is unclear what/if anything precipitated it. Patient states he had a little alcohol recently, but none immediately preceding this crisis; he denies recent illness, other drug use. He has a mild leukocytosis, but is afebrile with no infectious symptoms. Low concern for acute chest at this time given normal lung exam and absence of pulmonary symptoms.  - Chest x-ray - Dilaudid  PCA: 0.5 mg loading dose, 0.3 bolus dose, 1.25 mg one hour dose limit - Toradol  15 mg q6h for 5 doses - D5 1/2 NS, 125 cc/hr - LDH pending - Incentive spirometry - Encourage patient up and out of bed - Folic acid  1 mg daily - SpO2 goal > 95% - CBC, BMP in am  Asthma No wheezing on exam. Stable from a respiratory standpoint. No concerns for exacerbation at this time.   Diet: Normal VTE: Xarelto  IVF: D5 1/2 NS,125cc/hr Code: Full  Prior to Admission Living Arrangement: Home Anticipated Discharge Location: Home Barriers to Discharge: resolution of sickle cell crisis  Dispo: Admit patient to Observation with expected length of stay less than 2  midnights.  Signed: Ozell Riff, MD Internal Medicine Resident, PGY-1 [facility]  Pager: [phone]  12/27/2023, 5:48 PM

## 2023-12-28 DIAGNOSIS — Z8701 Personal history of pneumonia (recurrent): Secondary | ICD-10-CM | POA: Diagnosis not present

## 2023-12-28 DIAGNOSIS — D5700 Hb-SS disease with crisis, unspecified: Secondary | ICD-10-CM | POA: Diagnosis present

## 2023-12-28 DIAGNOSIS — J45909 Unspecified asthma, uncomplicated: Secondary | ICD-10-CM | POA: Diagnosis present

## 2023-12-28 DIAGNOSIS — G4700 Insomnia, unspecified: Secondary | ICD-10-CM | POA: Diagnosis present

## 2023-12-28 DIAGNOSIS — Z79899 Other long term (current) drug therapy: Secondary | ICD-10-CM | POA: Diagnosis not present

## 2023-12-28 DIAGNOSIS — Z832 Family history of diseases of the blood and blood-forming organs and certain disorders involving the immune mechanism: Secondary | ICD-10-CM | POA: Diagnosis not present

## 2023-12-28 DIAGNOSIS — Z8249 Family history of ischemic heart disease and other diseases of the circulatory system: Secondary | ICD-10-CM | POA: Diagnosis not present

## 2023-12-28 LAB — CBC
HCT: 36.6 % — ABNORMAL LOW (ref 39.0–52.0)
Hemoglobin: 12.6 g/dL — ABNORMAL LOW (ref 13.0–17.0)
MCH: 23.1 pg — ABNORMAL LOW (ref 26.0–34.0)
MCHC: 34.4 g/dL (ref 30.0–36.0)
MCV: 67.2 fL — ABNORMAL LOW (ref 80.0–100.0)
Platelets: 96 10*3/uL — ABNORMAL LOW (ref 150–400)
RBC: 5.45 MIL/uL (ref 4.22–5.81)
RDW: 15.3 % (ref 11.5–15.5)
WBC: 6.9 10*3/uL (ref 4.0–10.5)
nRBC: 0 % (ref 0.0–0.2)

## 2023-12-28 LAB — BASIC METABOLIC PANEL
Anion gap: 8 (ref 5–15)
BUN: 9 mg/dL (ref 6–20)
CO2: 23 mmol/L (ref 22–32)
Calcium: 9 mg/dL (ref 8.9–10.3)
Chloride: 106 mmol/L (ref 98–111)
Creatinine, Ser: 0.93 mg/dL (ref 0.61–1.24)
GFR, Estimated: >60 mL/min (ref >60)
Glucose, Bld: 93 mg/dL (ref 70–99)
Potassium: 3.8 mmol/L (ref 3.5–5.1)
Sodium: 137 mmol/L (ref 135–145)

## 2023-12-28 LAB — RETICULOCYTES
Immature Retic Fract: 12.7 % (ref 2.3–15.9)
RBC.: 5.44 MIL/uL (ref 4.22–5.81)
Retic Count, Absolute: 69.1 10*3/uL (ref 19.0–186.0)
Retic Ct Pct: 1.3 % (ref 0.4–3.1)

## 2023-12-28 LAB — HIV ANTIBODY (ROUTINE TESTING W REFLEX): HIV Screen 4th Generation wRfx: NONREACTIVE

## 2023-12-28 LAB — LACTATE DEHYDROGENASE: LDH: 460 U/L — ABNORMAL HIGH (ref 98–192)

## 2023-12-28 MED ORDER — ACETAMINOPHEN 500 MG PO TABS
1000.0000 mg | ORAL_TABLET | Freq: Three times a day (TID) | ORAL | Status: DC
  Administered 2023-12-28 – 2023-12-29 (×3): 1000 mg via ORAL
  Filled 2023-12-28 (×3): qty 2

## 2023-12-28 NOTE — Progress Notes (Signed)
 HD#0 SUBJECTIVE:  Patient Summary: Maurice Ferguson is a 23 y.o. male with a pertinent PMH of sickle cell disease and asthma who presented with acute severe lower back pain and is admitted for sickle cell pain crisis.  Overnight Events: Patient states his PCA stopped working for a period of time last night so he got frustrated and disconnected the CO2 monitor.   Interim History: Patient evaluated at bedside. He is doing better than yesterday, but had a rough night because he states the PCA wasn't working all night. It is now working this morning and his pain is better controlled. No new pulmonary symptoms.   OBJECTIVE:  Vital Signs: Vitals:   12/28/23 0404 12/28/23 0411 12/28/23 0731 12/28/23 1020  BP:  120/68  124/85  Pulse:  79  (!) 102  Resp: 16 20 14    Temp:  98.6 F (37 C)  98.6 F (37 C)  TempSrc:      SpO2: 100% 99%    Weight:      Height:       Supplemental O2: Room Air SpO2: 99 % O2 Flow Rate (L/min): 0 L/min  Filed Weights   12/27/23 0833  Weight: 57.2 kg    Intake/Output Summary (Last 24 hours) at 12/28/2023 1156 Last data filed at 12/28/2023 0409 Gross per 24 hour  Intake 1117.66 ml  Output 1000 ml  Net 117.66 ml   Net IO Since Admission: 117.66 mL [12/28/23 1156]  Physical Exam: General: well-appearing, in mild distress Cardiac: regular rate and rhythm Pulmonary: CTA bilaterally, normal rate and effort Abdomen: soft, non-tender Neuro: No focal deficits. Alert and oriented x 3.  Psych: Normal mood and affect.  Patient Lines/Drains/Airways Status     Active Line/Drains/Airways     Name Placement date Placement time Site Days   Peripheral IV 12/27/23 20 G 1 Anterior;Left Forearm 12/27/23  1558  Forearm  1            Pertinent Labs:    Latest Ref Rng & Units 12/27/2023    8:43 AM 11/25/2023    8:37 AM 04/04/2023    4:05 AM  CBC  WBC 4.0 - 10.5 K/uL 12.4  6.9  5.3   Hemoglobin 13.0 - 17.0 g/dL 86.2  87.2  89.2   Hematocrit 39.0 - 52.0 % 39.7   37.4  32.0   Platelets 150 - 400 K/uL 173  168  116        Latest Ref Rng & Units 12/27/2023    8:43 AM 11/25/2023    8:37 AM 04/04/2023    4:05 AM  CMP  Glucose 70 - 99 mg/dL 896  98  93   BUN 6 - 20 mg/dL 7  7  7    Creatinine 0.61 - 1.24 mg/dL 9.20  9.17  8.90   Sodium 135 - 145 mmol/L 140  139  137   Potassium 3.5 - 5.1 mmol/L 3.7  3.7  3.2   Chloride 98 - 111 mmol/L 105  105  103   CO2 22 - 32 mmol/L 22  25  26    Calcium 8.9 - 10.3 mg/dL 9.7  9.3  8.7   Total Protein 6.5 - 8.1 g/dL 7.2  6.6  6.6   Total Bilirubin 0.0 - 1.2 mg/dL 2.2  1.5  2.2   Alkaline Phos 38 - 126 U/L 63  51  56   AST 15 - 41 U/L 49  19  18   ALT 0 - 44 U/L  64  16  10    Pertinent Imaging: DG CHEST PORT 1 VIEW Result Date: 12/27/2023 CLINICAL DATA:  Sickle cell crisis with pain, initial encounter EXAM: PORTABLE CHEST 1 VIEW COMPARISON:  11/25/2023 FINDINGS: Cardiac shadow is stable. The lungs are well aerated bilaterally. No focal infiltrate or effusion is seen. No bony abnormality is noted. IMPRESSION: No active disease. Electronically Signed   By: Oneil Devonshire M.D.   On: 12/27/2023 18:54   ASSESSMENT/PLAN:  Assessment: Principal Problem:   Sickle cell crisis (HCC) Active Problems:   Sleep trouble  Plan: Sickle cell pain crisis History of sickle cell disease He is doing okay this morning.  States that the PCA pump was not working at some point last night, so he ended up having severe pain return.  We interrogated the machine and spoke with the nursing team.  We were not able to identify any gaps in dosing or malfunctions.  The patient also states that he pulled out the CO2 monitor at one point out of frustration that it was not working.  In any case, the PCA appears to be working fine this morning and the patient's pain is better controlled.  We will continue to monitor him today and ensure that his pain is adequately controlled.  If he continues to have difficulty with the PCA pump, we will transition him to  scheduled pain medications for better control. He denies any other new symptoms or concerns.  - Continue dilaudid  PCA, Toradol  15 mg q6h - Incentive spirometry every hour while awake - Folate 1 mg daily - SpO2 goal > 95% - CBC, BMP in am  Best Practice: Diet: Normal IVF: Fluids: None VTE: rivaroxaban  (XARELTO ) tablet 10 mg Start: 12/27/23 1430 Code: Full Family Contact: girlfriend, at bedside. DISPO: Anticipated discharge in 1-3 days to Home pending resolution of the sickle cell crisis.  Signature: Ozell Riff, MD  Internal Medicine Resident, PGY-1 [facility]  Pager: [phone] 11:56 AM, 12/28/2023   Please contact the on call pager after 5 pm and on weekends at [phone].

## 2023-12-28 NOTE — Plan of Care (Signed)

## 2023-12-29 ENCOUNTER — Other Ambulatory Visit (HOSPITAL_COMMUNITY): Payer: Self-pay

## 2023-12-29 DIAGNOSIS — D5700 Hb-SS disease with crisis, unspecified: Secondary | ICD-10-CM | POA: Diagnosis not present

## 2023-12-29 MED ORDER — SENNA 8.6 MG PO TABS: 2.0000 | ORAL_TABLET | Freq: Every day | ORAL | Status: DC

## 2023-12-29 MED ORDER — ONDANSETRON HCL 4 MG PO TABS
4.0000 mg | ORAL_TABLET | Freq: Three times a day (TID) | ORAL | 0 refills | Status: DC | PRN
  Filled 2023-12-29: qty 20, 7d supply, fill #0

## 2023-12-29 MED ORDER — POLYETHYLENE GLYCOL 3350 17 G PO PACK: 17.0000 g | PACK | Freq: Two times a day (BID) | ORAL | Status: DC

## 2023-12-29 MED ORDER — OXYCODONE HCL 5 MG PO TABS: 5.0000 mg | ORAL_TABLET | ORAL | Status: DC | PRN

## 2023-12-29 MED ORDER — HYDROMORPHONE HCL 2 MG PO TABS
1.0000 mg | ORAL_TABLET | ORAL | Status: DC
  Administered 2023-12-29: 1 mg via ORAL
  Filled 2023-12-29: qty 1

## 2023-12-29 MED ORDER — HYDROMORPHONE HCL 2 MG PO TABS
1.0000 mg | ORAL_TABLET | ORAL | 0 refills | Status: AC | PRN
  Filled 2023-12-29: qty 9, 3d supply, fill #0

## 2023-12-29 NOTE — Plan of Care (Signed)

## 2023-12-29 NOTE — Progress Notes (Signed)
 Patient refused lab this time

## 2023-12-29 NOTE — Progress Notes (Signed)
 Remaining Dilaudid from the PCA pump (1mg /ml) 35 ml wasted . Made the pharmacy aware by the charge nurse.Witnessed with Actuary

## 2023-12-29 NOTE — Discharge Summary (Signed)
 Name: Maurice Ferguson MRN: [id number] DOB: 2001/03/26 23 y.o. PCP: Patient, No Pcp Per  Date of Admission: 12/27/2023  8:26 AM Date of Discharge: 12/29/2023  Attending Physician: Dr. CHARLENA Eastern  DISCHARGE DIAGNOSIS:  Primary Problem: Sickle cell crisis [facility])   Hospital Problems: Principal Problem:   Sickle cell crisis (HCC) Active Problems:   Sickle cell pain crisis (HCC)   Sleep trouble    DISCHARGE MEDICATIONS:   Allergies as of 12/29/2023   No Known Allergies      Medication List     STOP taking these medications    atomoxetine  40 MG capsule Commonly known as: Strattera    mirtazapine  15 MG tablet Commonly known as: REMERON        TAKE these medications    HYDROmorphone  2 MG tablet Commonly known as: DILAUDID  Take 0.5 tablets (1 mg total) by mouth every 4 (four) hours as needed for up to 3 days for severe pain (pain score 7-10).   ondansetron  4 MG tablet Commonly known as: Zofran  Take 1 tablet (4 mg total) by mouth every 8 (eight) hours as needed for nausea or vomiting.   traZODone  50 MG tablet Commonly known as: DESYREL  Take 50 mg by mouth at bedtime as needed for sleep.        DISPOSITION AND FOLLOW-UP:  Mr.Maurice Ferguson was discharged from [facility] in [place] condition. At the hospital follow up visit please address:  Sickle cell disease: admitted for sickle cell pain crisis, improved with Dilaudid  PCA and fluids. Sent home with short course of pain medicine. Appointment scheduled for outpatient follow up at the [facility] in [place]. Ensure he follows up with them for help with management of his disease.   Insomnia: Recently started taking trazodone . Follow up on how his sleep has been recently and consider further treatment options.   Follow-up Appointments:  Follow-up Information     [facility] Follow up.   Specialty: Internal Medicine Contact  information: [address] [place]  [address] [phone] Additional information: [address] [place], [place] [address]        Francella Rogue, MD Follow up on 01/05/2024.   Specialty: Internal Medicine Why: Please go to your follow up appointment at 8:45 AM Contact information: [address] [place] [address] [phone]                HOSPITAL COURSE:  Patient Summary: Sickle cell pain crisis History of sickle cell disease He has known sickle cell disease and presented in a sickle cell pain crisis, with pain predominantly in his lower back. He has had pain crises in the past, but his last one was over two years ago. He was started on a PCA pump and IV fluids on admission to the hospital, and his pain improved over the course of two days. He was transitioned off the PCA pump and started on a short course of oral pain medications with outpatient follow up. He used to follow with [facility], but has not seen them in a while, as he currently lives in [place] and has difficulty traveling to [place]. We will schedule him a follow up appointment with [facility] in [place], [place].   DISCHARGE INSTRUCTIONS:   Discharge Instructions     Call MD for:  difficulty breathing, headache or visual disturbances   Complete by: As directed  Call MD for:  extreme fatigue   Complete by: As directed    Call MD for:  persistant dizziness or light-headedness   Complete by: As directed    Call MD for:  persistant nausea and vomiting   Complete by: As directed    Call MD for:  severe uncontrolled pain   Complete by: As directed    Diet - low sodium heart healthy   Complete by: As directed    Discharge instructions   Complete by: As directed    You were hospitalized for a sickle cell pain crisis. You were treated with pain medicine and your crisis resolved. Thank you for allowing us  to be part of your care.   We arranged for you to follow up at  the [facility] on Monday, January 6th at 2:20 pm. The address for the center is [address], [address] in [place]. They can help you manage your sickle cell disease.   We also scheduled an appointment at our Internal Medicine Clinic on January 10th at 8:45am. The clinic is on the ground floor of [facility].   Please note these changes made to your medications:   You may take hydrocodone  (Dilaudid ), 1 mg (1/2 a tablet) as needed for up to 6 times in a day. Do not take this medication more than 6 times in a day.   Please also take tylenol  regularly for the next few days, up to 3 grams total per day.   You may continue taking your other medications as prescribed.   Please call our clinic if you have any questions or concerns, we may be able to help and keep you from a long and expensive emergency room wait. Our clinic and after hours phone number is [phone], the best time to call is Monday through Friday 9 am to 4 pm but there is always someone available 24/7 if you have an emergency. If you need medication refills please notify your pharmacy one week in advance and they will send us  a request.   Increase activity slowly   Complete by: As directed        SUBJECTIVE:   Patient reports he is better today, ready for discharge. Mom and godmother bedside. Pain is improved no new concerns.   Discharge Vitals:   BP (!) 102/59 (BP Location: Right Arm)   Pulse 97   Temp 98.4 F (36.9 C) (Oral)   Resp 11   Ht 5' 10 (1.778 m)   Wt 57.2 kg   SpO2 (!) 71%   BMI 18.08 kg/m   OBJECTIVE:  Physical Exam Constitutional:      Appearance: Normal appearance.  Cardiovascular:     Rate and Rhythm: Normal rate and regular rhythm.  Pulmonary:     Effort: Pulmonary effort is normal.     Breath sounds: Normal breath sounds.  Abdominal:     General: Abdomen is flat.     Palpations: Abdomen is soft.  Neurological:     General: No focal deficit present.     Mental Status:  He is alert and oriented to person, place, and time.     Pertinent Labs, Studies, and Procedures:     Latest Ref Rng & Units 12/28/2023   10:34 AM 12/27/2023    8:43 AM 11/25/2023    8:37 AM  CBC  WBC 4.0 - 10.5 K/uL 6.9  12.4  6.9   Hemoglobin 13.0 - 17.0 g/dL 87.3  86.2  12.7  Hematocrit 39.0 - 52.0 % 36.6  39.7  37.4   Platelets 150 - 400 K/uL 96  173  168       Latest Ref Rng & Units 12/28/2023   10:34 AM 12/27/2023    8:43 AM 11/25/2023    8:37 AM  CMP  Glucose 70 - 99 mg/dL 93  896  98   BUN 6 - 20 mg/dL 9  7  7    Creatinine 0.61 - 1.24 mg/dL 9.06  9.20  9.17   Sodium 135 - 145 mmol/L 137  140  139   Potassium 3.5 - 5.1 mmol/L 3.8  3.7  3.7   Chloride 98 - 111 mmol/L 106  105  105   CO2 22 - 32 mmol/L 23  22  25    Calcium 8.9 - 10.3 mg/dL 9.0  9.7  9.3   Total Protein 6.5 - 8.1 g/dL  7.2  6.6   Total Bilirubin 0.0 - 1.2 mg/dL  2.2  1.5   Alkaline Phos 38 - 126 U/L  63  51   AST 15 - 41 U/L  49  19   ALT 0 - 44 U/L  64  16     DG CHEST PORT 1 VIEW Result Date: 12/27/2023 IMPRESSION: No active disease. Electronically Signed   By: Oneil Devonshire M.D.   On: 12/27/2023 18:54     Signed: Ozell Riff, MD Internal Medicine Resident, PGY-1 [facility]  Pager: [phone] 2:47 PM, 12/29/2023

## 2024-01-01 ENCOUNTER — Inpatient Hospital Stay: Payer: Self-pay | Admitting: Nurse Practitioner

## 2024-01-02 ENCOUNTER — Other Ambulatory Visit (HOSPITAL_COMMUNITY): Payer: Self-pay | Admitting: Psychiatry

## 2024-01-02 DIAGNOSIS — F330 Major depressive disorder, recurrent, mild: Secondary | ICD-10-CM

## 2024-01-05 ENCOUNTER — Encounter: Payer: MEDICAID | Admitting: Internal Medicine

## 2024-01-20 ENCOUNTER — Encounter (HOSPITAL_COMMUNITY): Payer: Self-pay

## 2024-01-20 ENCOUNTER — Other Ambulatory Visit: Payer: Self-pay

## 2024-01-20 ENCOUNTER — Emergency Department (HOSPITAL_COMMUNITY): Payer: MEDICAID

## 2024-01-20 ENCOUNTER — Emergency Department (HOSPITAL_COMMUNITY)
Admission: EM | Admit: 2024-01-20 | Discharge: 2024-01-21 | Discharge status: Against Medical Advice | Payer: MEDICAID | Attending: Emergency Medicine | Admitting: Emergency Medicine

## 2024-01-20 DIAGNOSIS — D57219 Sickle-cell/Hb-C disease with crisis, unspecified: Secondary | ICD-10-CM | POA: Diagnosis not present

## 2024-01-20 DIAGNOSIS — J101 Influenza due to other identified influenza virus with other respiratory manifestations: Secondary | ICD-10-CM | POA: Insufficient documentation

## 2024-01-20 DIAGNOSIS — Z20822 Contact with and (suspected) exposure to covid-19: Secondary | ICD-10-CM | POA: Insufficient documentation

## 2024-01-20 DIAGNOSIS — Z5321 Procedure and treatment not carried out due to patient leaving prior to being seen by health care provider: Secondary | ICD-10-CM | POA: Diagnosis not present

## 2024-01-20 DIAGNOSIS — R079 Chest pain, unspecified: Secondary | ICD-10-CM | POA: Diagnosis present

## 2024-01-20 LAB — COMPREHENSIVE METABOLIC PANEL
ALT: 17 U/L (ref 0–44)
AST: 15 U/L (ref 15–41)
Albumin: 4.1 g/dL (ref 3.5–5.0)
Alkaline Phosphatase: 66 U/L (ref 38–126)
Anion gap: 8 (ref 5–15)
BUN: 5 mg/dL — ABNORMAL LOW (ref 6–20)
CO2: 26 mmol/L (ref 22–32)
Calcium: 9.4 mg/dL (ref 8.9–10.3)
Chloride: 107 mmol/L (ref 98–111)
Creatinine, Ser: 0.74 mg/dL (ref 0.61–1.24)
GFR, Estimated: >60 mL/min (ref >60)
Glucose, Bld: 82 mg/dL (ref 70–99)
Potassium: 3.2 mmol/L — ABNORMAL LOW (ref 3.5–5.1)
Sodium: 141 mmol/L (ref 135–145)
Total Bilirubin: 1 mg/dL (ref 0.0–1.2)
Total Protein: 7.2 g/dL (ref 6.5–8.1)

## 2024-01-20 LAB — CBC WITH DIFFERENTIAL/PLATELET
Abs Immature Granulocytes: 0.03 10*3/uL (ref 0.00–0.07)
Basophils Absolute: 0 10*3/uL (ref 0.0–0.1)
Basophils Relative: 0 %
Eosinophils Absolute: 0.3 10*3/uL (ref 0.0–0.5)
Eosinophils Relative: 3 %
HCT: 35.1 % — ABNORMAL LOW (ref 39.0–52.0)
Hemoglobin: 11.8 g/dL — ABNORMAL LOW (ref 13.0–17.0)
Immature Granulocytes: 0 %
Lymphocytes Relative: 7 %
Lymphs Abs: 0.6 10*3/uL — ABNORMAL LOW (ref 0.7–4.0)
MCH: 23.4 pg — ABNORMAL LOW (ref 26.0–34.0)
MCHC: 33.6 g/dL (ref 30.0–36.0)
MCV: 69.6 fL — ABNORMAL LOW (ref 80.0–100.0)
Monocytes Absolute: 0.6 10*3/uL (ref 0.1–1.0)
Monocytes Relative: 7 %
Neutro Abs: 7 10*3/uL (ref 1.7–7.7)
Neutrophils Relative %: 83 %
Platelets: 232 10*3/uL (ref 150–400)
RBC: 5.04 MIL/uL (ref 4.22–5.81)
RDW: 15.5 % (ref 11.5–15.5)
WBC: 8.5 10*3/uL (ref 4.0–10.5)
nRBC: 0 % (ref 0.0–0.2)

## 2024-01-20 LAB — RESP PANEL BY RT-PCR (RSV, FLU A&B, COVID)  RVPGX2
Influenza A by PCR: POSITIVE — AB
Influenza B by PCR: NEGATIVE
Resp Syncytial Virus by PCR: NEGATIVE
SARS Coronavirus 2 by RT PCR: NEGATIVE

## 2024-01-20 LAB — RETICULOCYTES
Immature Retic Fract: 12.9 % (ref 2.3–15.9)
RBC.: 5.06 MIL/uL (ref 4.22–5.81)
Retic Count, Absolute: 72.9 10*3/uL (ref 19.0–186.0)
Retic Ct Pct: 1.4 % (ref 0.4–3.1)

## 2024-01-20 LAB — TROPONIN I (HIGH SENSITIVITY): Troponin I (High Sensitivity): <2 ng/L (ref <18)

## 2024-01-20 MED ORDER — HYDROMORPHONE HCL 1 MG/ML IJ SOLN
1.0000 mg | Freq: Once | INTRAMUSCULAR | Status: DC
Start: 2024-01-20 — End: 2024-01-21

## 2024-01-20 NOTE — ED Triage Notes (Signed)
Chest pain and sob for 2 days. Pt was d/c from [facility] for sickle cell crisis beginning of January. Pt states he feels sob sitting in the chair and is coughing up dark green phlegm.

## 2024-03-26 ENCOUNTER — Encounter (HOSPITAL_COMMUNITY): Payer: Self-pay | Admitting: Emergency Medicine

## 2024-03-26 ENCOUNTER — Emergency Department (HOSPITAL_COMMUNITY)
Admission: EM | Admit: 2024-03-26 | Discharge: 2024-03-27 | Disposition: A | Discharge status: Home / Self Care | Payer: MEDICAID

## 2024-03-26 ENCOUNTER — Other Ambulatory Visit: Payer: Self-pay

## 2024-03-26 ENCOUNTER — Emergency Department (HOSPITAL_COMMUNITY): Payer: MEDICAID

## 2024-03-26 DIAGNOSIS — T50901A Poisoning by unspecified drugs, medicaments and biological substances, accidental (unintentional), initial encounter: Secondary | ICD-10-CM

## 2024-03-26 DIAGNOSIS — T40601A Poisoning by unspecified narcotics, accidental (unintentional), initial encounter: Secondary | ICD-10-CM | POA: Insufficient documentation

## 2024-03-26 DIAGNOSIS — D72829 Elevated white blood cell count, unspecified: Secondary | ICD-10-CM | POA: Diagnosis not present

## 2024-03-26 DIAGNOSIS — N179 Acute kidney failure, unspecified: Secondary | ICD-10-CM | POA: Insufficient documentation

## 2024-03-26 LAB — BASIC METABOLIC PANEL WITH GFR
Anion gap: 12 (ref 5–15)
BUN: 14 mg/dL (ref 6–20)
CO2: 25 mmol/L (ref 22–32)
Calcium: 9.1 mg/dL (ref 8.9–10.3)
Chloride: 107 mmol/L (ref 98–111)
Creatinine, Ser: 1.56 mg/dL — ABNORMAL HIGH (ref 0.61–1.24)
GFR, Estimated: >60 mL/min (ref >60)
Glucose, Bld: 87 mg/dL (ref 70–99)
Potassium: 5 mmol/L (ref 3.5–5.1)
Sodium: 144 mmol/L (ref 135–145)

## 2024-03-26 LAB — CBC
HCT: 36.8 % — ABNORMAL LOW (ref 39.0–52.0)
Hemoglobin: 12.5 g/dL — ABNORMAL LOW (ref 13.0–17.0)
MCH: 23.1 pg — ABNORMAL LOW (ref 26.0–34.0)
MCHC: 34 g/dL (ref 30.0–36.0)
MCV: 67.9 fL — ABNORMAL LOW (ref 80.0–100.0)
Platelets: 138 10*3/uL — ABNORMAL LOW (ref 150–400)
RBC: 5.42 MIL/uL (ref 4.22–5.81)
RDW: 17.7 % — ABNORMAL HIGH (ref 11.5–15.5)
WBC: 26.7 10*3/uL — ABNORMAL HIGH (ref 4.0–10.5)
nRBC: 0 % (ref 0.0–0.2)

## 2024-03-26 LAB — ETHANOL: Alcohol, Ethyl (B): <10 mg/dL (ref <10)

## 2024-03-26 MED ORDER — LACTATED RINGERS IV BOLUS
1000.0000 mL | Freq: Once | INTRAVENOUS | Status: AC
  Administered 2024-03-27: 1000 mL via INTRAVENOUS

## 2024-03-26 MED ORDER — NALOXONE HCL 0.4 MG/ML IJ SOLN
0.4000 mg | Freq: Once | INTRAMUSCULAR | Status: AC
  Administered 2024-03-27: 0.4 mg via INTRAVENOUS
  Filled 2024-03-26: qty 1

## 2024-03-26 NOTE — ED Provider Notes (Signed)
 [place] EMERGENCY DEPARTMENT AT [facility] Provider Note   CSN: [id number] Arrival date & time: 03/26/24  2114     History {Add pertinent medical, surgical, social history, OB history to HPI:1} Chief Complaint  Patient presents with   Drug Overdose    Maurice Ferguson is a 23 y.o. male.  24 year old male with past medical history of sickle cell disease presenting to the emergency department today after an apparent drug overdose.  The patient apparently was found unresponsive and received Narcan at home with improvement in his mental status.  He was subsequently brought to the ER for further evaluation.  The patient was apparently in his normal state of health earlier today before this occurred.  He was brought to the emergency department at that time for further evaluation.  The patient does admit to taking pills.  He is denying suicidal or homicidal ideations.  He is somnolent but will arouse to verbal stimuli.   Drug Overdose       Home Medications Prior to Admission medications   Medication Sig Start Date End Date Taking? Authorizing Provider  ondansetron (ZOFRAN) 4 MG tablet Take 1 tablet (4 mg total) by mouth every 8 (eight) hours as needed for nausea or vomiting. 12/29/23   Marrianne Mood, MD  traZODone (DESYREL) 50 MG tablet Take 50 mg by mouth at bedtime as needed for sleep.    [provider]      Allergies    Patient has no known allergies.    Review of Systems   Review of Systems  Reason unable to perform ROS: Pt somnolent, history from medics/mother.    Physical Exam Updated Vital Signs BP 123/81 (BP Location: Left Arm)   Pulse (!) 113   Temp 99.1 F (37.3 C) (Axillary)   Resp 20   SpO2 98%  Physical Exam Vitals and nursing note reviewed.   Gen: Somnolent but will arouse to verbal stimuli Eyes: Pupils are 3 mm and sluggish bilaterally HEENT: no oropharyngeal swelling Neck: trachea midline, no meningismus Resp: clear to  auscultation bilaterally Card: RRR, no murmurs, rubs, or gallops Abd: nontender, nondistended Extremities: no calf tenderness, no edema Vascular: 2+ radial pulses bilaterally, 2+ DP pulses bilaterally Skin: no rashes Psyc: acting appropriately   ED Results / Procedures / Treatments   Labs (all labs ordered are listed, but only abnormal results are displayed) Labs Reviewed  CBC - Abnormal; Notable for the following components:      Result Value   WBC 26.7 (*)    Hemoglobin 12.5 (*)    HCT 36.8 (*)    MCV 67.9 (*)    MCH 23.1 (*)    RDW 17.7 (*)    Platelets 138 (*)    All other components within normal limits  BASIC METABOLIC PANEL WITH GFR - Abnormal; Notable for the following components:   Creatinine, Ser 1.56 (*)    All other components within normal limits  ETHANOL  RAPID URINE DRUG SCREEN, HOSP PERFORMED  RETICULOCYTES  URINALYSIS, ROUTINE W REFLEX MICROSCOPIC    EKG None  Radiology No results found.  Procedures Procedures  {Document cardiac monitor, telemetry assessment procedure when appropriate:1}  Medications Ordered in ED Medications  lactated ringers bolus 1,000 mL (has no administration in time range)  naloxone (NARCAN) injection 0.4 mg (has no administration in time range)    ED Course/ Medical Decision Making/ A&P   {   Click here for ABCD2, HEART and other calculatorsREFRESH Note before signing :1}  Medical Decision Making 23 year old male with past medical history of sickle cell disease presenting to the emergency department today after an apparent drug overdose.  The patient did respond to Narcan.  I will further evaluate the patient here with basic labs and keep the patient on the cardiac monitor to determine the need for further Narcan administration.  Will also obtain an alcohol level on the patient.  I will reevaluate for ultimate disposition.  The patient does have a significant leukocytosis as well as an AKI.   Chest x-ray and urinalysis are ordered.  On reassessment the patient is still somnolent but will arouse to verbal stimuli.  Will give the patient additional dose of Narcan.  IV fluids are ordered for his AKI.  I will also order a CT scan of his head for further evaluation and possible head injury.  Amount and/or Complexity of Data Reviewed Labs: ordered. Radiology: ordered.  Risk Prescription drug management.   ***  {Document critical care time when appropriate:1} {Document review of labs and clinical decision tools ie heart score, Chads2Vasc2 etc:1}  {Document your independent review of radiology images, and any outside records:1} {Document your discussion with family members, caretakers, and with consultants:1} {Document social determinants of health affecting pt's care:1} {Document your decision making why or why not admission, treatments were needed:1} Final Clinical Impression(s) / ED Diagnoses Final diagnoses:  None    Rx / DC Orders ED Discharge Orders     None

## 2024-03-26 NOTE — ED Triage Notes (Addendum)
 Pt arrives w/ [facility] - Pt took unknown drugs at home. Grandma called pts mother at 7pm to let her know pt was unresponsive. Mother didn't call 911 until 830pm. Mother reports pt takes "pills" but unsure what he took tonight  6mg  narcan given my mom at home. VSS at this time.  18G left forearm

## 2024-03-27 LAB — CBC WITH DIFFERENTIAL/PLATELET
Abs Immature Granulocytes: 0.1 10*3/uL — ABNORMAL HIGH (ref 0.00–0.07)
Basophils Absolute: 0.1 10*3/uL (ref 0.0–0.1)
Basophils Relative: 0 %
Eosinophils Absolute: 0.1 10*3/uL (ref 0.0–0.5)
Eosinophils Relative: 1 %
HCT: 30.9 % — ABNORMAL LOW (ref 39.0–52.0)
Hemoglobin: 10.7 g/dL — ABNORMAL LOW (ref 13.0–17.0)
Immature Granulocytes: 1 %
Lymphocytes Relative: 6 %
Lymphs Abs: 1.2 10*3/uL (ref 0.7–4.0)
MCH: 23.3 pg — ABNORMAL LOW (ref 26.0–34.0)
MCHC: 34.6 g/dL (ref 30.0–36.0)
MCV: 67.2 fL — ABNORMAL LOW (ref 80.0–100.0)
Monocytes Absolute: 1.4 10*3/uL — ABNORMAL HIGH (ref 0.1–1.0)
Monocytes Relative: 7 %
Neutro Abs: 17.5 10*3/uL — ABNORMAL HIGH (ref 1.7–7.7)
Neutrophils Relative %: 85 %
Platelets: 107 10*3/uL — ABNORMAL LOW (ref 150–400)
RBC: 4.6 MIL/uL (ref 4.22–5.81)
RDW: 16.9 % — ABNORMAL HIGH (ref 11.5–15.5)
WBC: 20.4 10*3/uL — ABNORMAL HIGH (ref 4.0–10.5)
nRBC: 0.1 % (ref 0.0–0.2)

## 2024-03-27 LAB — RAPID URINE DRUG SCREEN, HOSP PERFORMED
Amphetamines: NOT DETECTED
Barbiturates: NOT DETECTED
Benzodiazepines: NOT DETECTED
Cocaine: POSITIVE — AB
Opiates: NOT DETECTED
Tetrahydrocannabinol: POSITIVE — AB

## 2024-03-27 LAB — RETICULOCYTES
Immature Retic Fract: 9.6 % (ref 2.3–15.9)
RBC.: 4.54 MIL/uL (ref 4.22–5.81)
Retic Count, Absolute: 57.7 10*3/uL (ref 19.0–186.0)
Retic Ct Pct: 1.3 % (ref 0.4–3.1)

## 2024-03-27 LAB — URINALYSIS, ROUTINE W REFLEX MICROSCOPIC
Bilirubin Urine: NEGATIVE
Glucose, UA: NEGATIVE mg/dL
Ketones, ur: 5 mg/dL — AB
Leukocytes,Ua: NEGATIVE
Nitrite: NEGATIVE
Protein, ur: 30 mg/dL — AB
Specific Gravity, Urine: 1.01 (ref 1.005–1.030)
pH: 5 (ref 5.0–8.0)

## 2024-03-27 NOTE — ED Notes (Signed)
 In and out cath was done to obtain urine sample

## 2024-03-27 NOTE — ED Notes (Signed)
 Pt attempted to use urinal to obtain urine sample but was unsuccessful. Will check back with pt after fluids are done.

## 2024-03-27 NOTE — ED Provider Notes (Signed)
 I assumed care of this patient from previous provider.  Please see their note for further details of history, exam, and MDM.   Briefly patient is a 23 y.o. male who presented for unresponsiveness concerning for opiate overdose.  Patient was given 6 mg of Narcan at home.  Workup thus far was notable for leukocytosis.  No infectious sources.  Currently awaiting CT head and rest of the workup.  CT head negative for ICH.  Chest x-ray without evidence of pneumonia.  UA without infection.  UDS positive for cocaine and THC.  Negative for opiates.  Patient metabolize.  Awake, alert, sober.  Tolerating p.o.  Presentation is not concerning for sickle cell crisis.  The patient appears reasonably screened and/or stabilized for discharge and I doubt any other medical condition or other [facility] requiring further screening, evaluation, or treatment in the ED at this time. I have discussed the findings, Dx and Tx plan with the patient/family who expressed understanding and agree(s) with the plan. Discharge instructions discussed at length. The patient/family was given strict return precautions who verbalized understanding of the instructions. No further questions at time of discharge.  Disposition: Discharge  Condition: Good  ED Discharge Orders     None        Follow Up: Primary care provider  Schedule an appointment as soon as possible for a visit           Denney Shein, Amadeo Garnet, MD 03/27/24 [phone]

## 2024-04-30 ENCOUNTER — Other Ambulatory Visit (HOSPITAL_COMMUNITY): Payer: Self-pay | Admitting: Psychiatry

## 2024-06-07 ENCOUNTER — Emergency Department (HOSPITAL_COMMUNITY)
Admission: EM | Admit: 2024-06-07 | Discharge: 2024-06-07 | Disposition: A | Discharge status: Home / Self Care | Payer: MEDICAID | Attending: Emergency Medicine | Admitting: Emergency Medicine

## 2024-06-07 ENCOUNTER — Encounter (HOSPITAL_COMMUNITY): Payer: Self-pay

## 2024-06-07 ENCOUNTER — Other Ambulatory Visit: Payer: Self-pay

## 2024-06-07 DIAGNOSIS — D5700 Hb-SS disease with crisis, unspecified: Secondary | ICD-10-CM | POA: Insufficient documentation

## 2024-06-07 DIAGNOSIS — J45909 Unspecified asthma, uncomplicated: Secondary | ICD-10-CM | POA: Insufficient documentation

## 2024-06-07 LAB — CBC WITH DIFFERENTIAL/PLATELET
Abs Immature Granulocytes: 0.03 10*3/uL (ref 0.00–0.07)
Basophils Absolute: 0 10*3/uL (ref 0.0–0.1)
Basophils Relative: 1 %
Eosinophils Absolute: 0.5 10*3/uL (ref 0.0–0.5)
Eosinophils Relative: 5 %
HCT: 38.8 % — ABNORMAL LOW (ref 39.0–52.0)
Hemoglobin: 12.8 g/dL — ABNORMAL LOW (ref 13.0–17.0)
Immature Granulocytes: 0 %
Lymphocytes Relative: 21 %
Lymphs Abs: 1.8 10*3/uL (ref 0.7–4.0)
MCH: 23.5 pg — ABNORMAL LOW (ref 26.0–34.0)
MCHC: 33 g/dL (ref 30.0–36.0)
MCV: 71.2 fL — ABNORMAL LOW (ref 80.0–100.0)
Monocytes Absolute: 0.6 10*3/uL (ref 0.1–1.0)
Monocytes Relative: 7 %
Neutro Abs: 5.9 10*3/uL (ref 1.7–7.7)
Neutrophils Relative %: 66 %
Platelets: 157 10*3/uL (ref 150–400)
RBC: 5.45 MIL/uL (ref 4.22–5.81)
RDW: 14.8 % (ref 11.5–15.5)
WBC: 8.8 10*3/uL (ref 4.0–10.5)
nRBC: 0 % (ref 0.0–0.2)

## 2024-06-07 LAB — COMPREHENSIVE METABOLIC PANEL WITH GFR
ALT: 17 U/L (ref 0–44)
AST: 16 U/L (ref 15–41)
Albumin: 4.3 g/dL (ref 3.5–5.0)
Alkaline Phosphatase: 67 U/L (ref 38–126)
Anion gap: 9 (ref 5–15)
BUN: 9 mg/dL (ref 6–20)
CO2: 27 mmol/L (ref 22–32)
Calcium: 9.1 mg/dL (ref 8.9–10.3)
Chloride: 104 mmol/L (ref 98–111)
Creatinine, Ser: 0.99 mg/dL (ref 0.61–1.24)
GFR, Estimated: >60 mL/min (ref >60)
Glucose, Bld: 99 mg/dL (ref 70–99)
Potassium: 3.7 mmol/L (ref 3.5–5.1)
Sodium: 140 mmol/L (ref 135–145)
Total Bilirubin: 1.4 mg/dL — ABNORMAL HIGH (ref 0.0–1.2)
Total Protein: 6.8 g/dL (ref 6.5–8.1)

## 2024-06-07 LAB — RETICULOCYTES
Immature Retic Fract: 17.6 % — ABNORMAL HIGH (ref 2.3–15.9)
RBC.: 5.32 MIL/uL (ref 4.22–5.81)
Retic Count, Absolute: 74.5 10*3/uL (ref 19.0–186.0)
Retic Ct Pct: 1.4 % (ref 0.4–3.1)

## 2024-06-07 MED ORDER — OXYCODONE-ACETAMINOPHEN 5-325 MG PO TABS: 1.0000 | ORAL_TABLET | Freq: Four times a day (QID) | ORAL | 0 refills | Status: DC | PRN

## 2024-06-07 MED ORDER — HYDROMORPHONE HCL 1 MG/ML IJ SOLN: 1.0000 mg | INTRAMUSCULAR | Status: AC

## 2024-06-07 MED ORDER — SODIUM CHLORIDE 0.45 % IV SOLN: INTRAVENOUS | Status: DC

## 2024-06-07 MED ORDER — HYDROMORPHONE HCL 1 MG/ML IJ SOLN
1.0000 mg | INTRAMUSCULAR | Status: AC
  Filled 2024-06-07: qty 1

## 2024-06-07 MED ORDER — KETOROLAC TROMETHAMINE 15 MG/ML IJ SOLN
15.0000 mg | INTRAMUSCULAR | Status: AC
  Administered 2024-06-07: 15 mg via INTRAVENOUS
  Filled 2024-06-07: qty 1

## 2024-06-07 MED ORDER — ONDANSETRON HCL 4 MG/2ML IJ SOLN
4.0000 mg | INTRAMUSCULAR | Status: DC | PRN
  Filled 2024-06-07: qty 2

## 2024-06-07 MED ORDER — HYDROMORPHONE HCL 1 MG/ML IJ SOLN
0.5000 mg | INTRAMUSCULAR | Status: AC
  Filled 2024-06-07: qty 1

## 2024-06-07 NOTE — ED Triage Notes (Signed)
 Pt c/o SCC with pain located in lower back that began this morning. Tried otc meds to no relief. Denies chest pain/sob at this time

## 2024-06-07 NOTE — ED Provider Notes (Signed)
 [place] EMERGENCY DEPARTMENT AT [facility] Provider Note   CSN: [id number] Arrival date & time: 06/07/24  1755     Patient presents with: Sickle Cell Pain Crisis   Maurice Ferguson is a 23 y.o. male.   Patient with history of acute chest, sickle cell anemia, asthma presents today with complaints of sickle cell pain crisis. He states that symptoms began this morning. He has been taking Tylenol  and ibuprofen  with minimal improvement. Does not have home narcotics for his sickle cell. He endorses pain to his lower back typical of his sickle cell crises pain.  Denies loss of bowel or bladder function or saddle paresthesias.  No chest pain or shortness of breath.  No fevers or chills.   The history is provided by the patient. No language interpreter was used.  Sickle Cell Pain Crisis      Prior to Admission medications   Medication Sig Start Date End Date Taking? Authorizing Provider  ondansetron  (ZOFRAN ) 4 MG tablet Take 1 tablet (4 mg total) by mouth every 8 (eight) hours as needed for nausea or vomiting. 12/29/23   Adria Hopkins, MD  traZODone  (DESYREL ) 50 MG tablet TAKE 1 TABLET(50 MG) BY MOUTH AT BEDTIME 05/01/24   Parsons, Brittney E, NP    Allergies: Patient has no known allergies.    Review of Systems  All other systems reviewed and are negative.   Updated Vital Signs BP 131/75 (BP Location: Right Arm)   Pulse 72   Temp 97.8 F (36.6 C) (Oral)   Resp 17   SpO2 99%   Physical Exam Vitals and nursing note reviewed.  Constitutional:      General: He is not in acute distress.    Appearance: Normal appearance. He is normal weight. He is not ill-appearing, toxic-appearing or diaphoretic.  HENT:     Head: Normocephalic and atraumatic.   Cardiovascular:     Rate and Rhythm: Normal rate.  Pulmonary:     Effort: Pulmonary effort is normal. No respiratory distress.   Musculoskeletal:        General: Normal range of motion.     Cervical back: Normal range of  motion.   Skin:    General: Skin is warm and dry.   Neurological:     General: No focal deficit present.     Mental Status: He is alert.   Psychiatric:        Mood and Affect: Mood normal.        Behavior: Behavior normal.     (all labs ordered are listed, but only abnormal results are displayed) Labs Reviewed  COMPREHENSIVE METABOLIC PANEL WITH GFR - Abnormal; Notable for the following components:      Result Value   Total Bilirubin 1.4 (*)    All other components within normal limits  CBC WITH DIFFERENTIAL/PLATELET - Abnormal; Notable for the following components:   Hemoglobin 12.8 (*)    HCT 38.8 (*)    MCV 71.2 (*)    MCH 23.5 (*)    All other components within normal limits  RETICULOCYTES - Abnormal; Notable for the following components:   Immature Retic Fract 17.6 (*)    All other components within normal limits    EKG: None  Radiology: No results found.   Procedures   Medications Ordered in the ED  HYDROmorphone  (DILAUDID ) injection 1 mg (has no administration in time range)  ondansetron  (ZOFRAN ) injection 4 mg (has no administration in time range)  0.45 % sodium chloride   infusion ( Intravenous New Bag/Given 06/07/24 2028)  ketorolac  (TORADOL ) 15 MG/ML injection 15 mg (15 mg Intravenous Given 06/07/24 2020)  HYDROmorphone  (DILAUDID ) injection 0.5 mg (0.5 mg Intravenous Given 06/07/24 2125)  HYDROmorphone  (DILAUDID ) injection 1 mg (1 mg Intravenous Given 06/07/24 2020)                                    Medical Decision Making Amount and/or Complexity of Data Reviewed Labs: ordered.  Risk Prescription drug management.   This patient is a 23 y.o. male who presents to the ED for concern of sickle cell pain crisis, this involves an extensive number of treatment options, and is a complaint that carries with it a high risk of complications and morbidity. The emergent differential diagnosis prior to evaluation includes, but is not limited to,  acute chest,  chronic pain, sickle cell crisis . This is not an exhaustive differential.   Past Medical History / Co-morbidities / Social History:  has a past medical history of Acute chest pain, ADHD (attention deficit hyperactivity disorder), Allergy, Asthma, Asthma, Impacted teeth, Pneumonia, Sickle cell anemia (HCC), and Vision abnormalities.  Additional history: Chart reviewed.   Physical Exam: Physical exam performed. The pertinent findings include: no acute physical exam abnormalities  Lab Tests: I ordered, and personally interpreted labs.  The pertinent results include:  no acute laboratory abnormalities   Medications: I ordered medication including Toradol , Dilaudid , fluids for sickle cell pain crisis. Reevaluation of the patient after these medicines showed that the patient improved. I have reviewed the patients home medicines and have made adjustments as needed.   Disposition: After consideration of the diagnostic results and the patients response to treatment, I feel that emergency department workup does not suggest an emergent condition requiring admission or immediate intervention beyond what has been performed at this time. The plan is: Discharge with close outpatient follow-up and return precautions.  Upon reassessment after above interventions, patient is feeling better and ready to go home.  PDMP reviewed.  Patient does not have an active prescription for narcotics.  He is requesting a few doses of oxycodone  over the weekend until he can follow-up outpatient.  Patient advised not to drive or operate heavy machinery while taking this medication. Evaluation and diagnostic testing in the emergency department does not suggest an emergent condition requiring admission or immediate intervention beyond what has been performed at this time.  Plan for discharge with close PCP follow-up.  Patient is understanding and amenable with plan, educated on red flag symptoms that would prompt immediate return.   Patient discharged in stable condition.  Final diagnoses:  Sickle cell pain crisis [facility])    ED Discharge Orders          Ordered    oxyCODONE -acetaminophen  (PERCOCET/ROXICET) 5-325 MG tablet  Every 6 hours PRN        06/07/24 2219          An After Visit Summary was printed and given to the patient.      Fredna Jasper 06/07/24 2220    Nicklas Barns, MD 06/07/24 [phone]

## 2024-06-07 NOTE — Discharge Instructions (Signed)
 As we discussed, your workup in the ER today was reassuring for acute findings.  Given that you feel better after medicines provided to you today, no further evaluation is indicated.  Given that you do not have a home prescription for narcotics, have given you a prescription for Percocet which is a narcotic pain medication you can take as prescribed as needed for severe pain only.  Do not drive or operate heavy machinery while taking this medication as it can be sedating.  Moving forward, you will need to go through the sickle cell center to receive pain medicine.  Please call them to schedule an appointment.  Return if development of any new or worsening symptoms.

## 2024-07-15 ENCOUNTER — Other Ambulatory Visit: Payer: Self-pay

## 2024-07-15 ENCOUNTER — Encounter (HOSPITAL_COMMUNITY): Payer: Self-pay

## 2024-07-15 ENCOUNTER — Emergency Department (HOSPITAL_COMMUNITY): Payer: MEDICAID

## 2024-07-15 ENCOUNTER — Emergency Department (HOSPITAL_COMMUNITY)
Admission: EM | Admit: 2024-07-15 | Discharge: 2024-07-15 | Disposition: A | Discharge status: Home / Self Care | Payer: MEDICAID | Attending: Emergency Medicine | Admitting: Emergency Medicine

## 2024-07-15 DIAGNOSIS — E876 Hypokalemia: Secondary | ICD-10-CM | POA: Insufficient documentation

## 2024-07-15 DIAGNOSIS — D57219 Sickle-cell/Hb-C disease with crisis, unspecified: Secondary | ICD-10-CM | POA: Insufficient documentation

## 2024-07-15 DIAGNOSIS — J45909 Unspecified asthma, uncomplicated: Secondary | ICD-10-CM | POA: Insufficient documentation

## 2024-07-15 DIAGNOSIS — D5700 Hb-SS disease with crisis, unspecified: Secondary | ICD-10-CM

## 2024-07-15 DIAGNOSIS — R0789 Other chest pain: Secondary | ICD-10-CM | POA: Diagnosis present

## 2024-07-15 LAB — CBC WITH DIFFERENTIAL/PLATELET
Abs Immature Granulocytes: 0.04 K/uL (ref 0.00–0.07)
Basophils Absolute: 0 K/uL (ref 0.0–0.1)
Basophils Relative: 1 %
Eosinophils Absolute: 0.4 K/uL (ref 0.0–0.5)
Eosinophils Relative: 5 %
HCT: 39.7 % (ref 39.0–52.0)
Hemoglobin: 13 g/dL (ref 13.0–17.0)
Immature Granulocytes: 1 %
Lymphocytes Relative: 17 %
Lymphs Abs: 1.5 K/uL (ref 0.7–4.0)
MCH: 22.8 pg — ABNORMAL LOW (ref 26.0–34.0)
MCHC: 32.7 g/dL (ref 30.0–36.0)
MCV: 69.6 fL — ABNORMAL LOW (ref 80.0–100.0)
Monocytes Absolute: 0.7 K/uL (ref 0.1–1.0)
Monocytes Relative: 8 %
Neutro Abs: 6 K/uL (ref 1.7–7.7)
Neutrophils Relative %: 68 %
Platelets: 144 K/uL — ABNORMAL LOW (ref 150–400)
RBC: 5.7 MIL/uL (ref 4.22–5.81)
RDW: 15.3 % (ref 11.5–15.5)
WBC: 8.6 K/uL (ref 4.0–10.5)
nRBC: 0 % (ref 0.0–0.2)

## 2024-07-15 LAB — COMPREHENSIVE METABOLIC PANEL WITH GFR
ALT: 18 U/L (ref 0–44)
AST: 21 U/L (ref 15–41)
Albumin: 4.2 g/dL (ref 3.5–5.0)
Alkaline Phosphatase: 59 U/L (ref 38–126)
Anion gap: 7 (ref 5–15)
BUN: 9 mg/dL (ref 6–20)
CO2: 26 mmol/L (ref 22–32)
Calcium: 8.9 mg/dL (ref 8.9–10.3)
Chloride: 108 mmol/L (ref 98–111)
Creatinine, Ser: 1.11 mg/dL (ref 0.61–1.24)
GFR, Estimated: >60 mL/min (ref >60)
Glucose, Bld: 95 mg/dL (ref 70–99)
Potassium: 3.2 mmol/L — ABNORMAL LOW (ref 3.5–5.1)
Sodium: 141 mmol/L (ref 135–145)
Total Bilirubin: 1.3 mg/dL — ABNORMAL HIGH (ref 0.0–1.2)
Total Protein: 6.9 g/dL (ref 6.5–8.1)

## 2024-07-15 LAB — RETICULOCYTES
Immature Retic Fract: 19.6 % — ABNORMAL HIGH (ref 2.3–15.9)
RBC.: 5.71 MIL/uL (ref 4.22–5.81)
Retic Count, Absolute: 86.2 K/uL (ref 19.0–186.0)
Retic Ct Pct: 1.5 % (ref 0.4–3.1)

## 2024-07-15 LAB — TROPONIN I (HIGH SENSITIVITY): Troponin I (High Sensitivity): 7 ng/L (ref <18)

## 2024-07-15 MED ORDER — POTASSIUM CHLORIDE CRYS ER 20 MEQ PO TBCR
40.0000 meq | EXTENDED_RELEASE_TABLET | Freq: Once | ORAL | Status: AC
  Administered 2024-07-15: 40 meq via ORAL
  Filled 2024-07-15: qty 2

## 2024-07-15 MED ORDER — HYDROMORPHONE HCL 1 MG/ML IJ SOLN
2.0000 mg | Freq: Once | INTRAMUSCULAR | Status: AC
  Administered 2024-07-15: 2 mg via INTRAVENOUS
  Filled 2024-07-15: qty 2

## 2024-07-15 MED ORDER — OXYCODONE-ACETAMINOPHEN 5-325 MG PO TABS
1.0000 | ORAL_TABLET | Freq: Four times a day (QID) | ORAL | 0 refills | Status: DC | PRN
Start: 2024-07-15 — End: 2024-07-16

## 2024-07-15 MED ORDER — HYDROMORPHONE HCL 1 MG/ML IJ SOLN
1.0000 mg | Freq: Once | INTRAMUSCULAR | Status: AC
  Administered 2024-07-15: 1 mg via INTRAVENOUS
  Filled 2024-07-15: qty 1

## 2024-07-15 NOTE — Discharge Instructions (Signed)
 Please use Tylenol or ibuprofen for pain.  You may use 600 mg ibuprofen every 6 hours or 1000 mg of Tylenol every 6 hours.  You may choose to alternate between the 2.  This would be most effective.  Not to exceed 4 g of Tylenol within 24 hours.  Not to exceed 3200 mg ibuprofen 24 hours.  You can use the stronger narcotic pain medication in place of Tylenol for severe break through pain.  If you take the narcotic pain medication that we prescribed recommend that you also take a laxative such as MiraLAX or Dulcolax every day that you take the narcotic pain medicine, and drink plenty of fluids, 50 to 64 ounces to prevent any constipation.

## 2024-07-15 NOTE — ED Triage Notes (Addendum)
 Patient said he began having a sickle cell pain crisis that began yesterday. Pain is in the middle of his chest that moves to his upper back. Unable to sleep due to pain. Last smoked marijuana Friday and thought it was related to that but pain never went away. Took tylenol  today along with a muscle relaxer.

## 2024-07-15 NOTE — ED Provider Notes (Signed)
 [place] EMERGENCY DEPARTMENT AT [facility] Provider Note   CSN: [id number] Arrival date & time: 07/15/24  9083     Patient presents with: Sickle Cell Pain Crisis   Maurice Ferguson is a 23 y.o. male with past medical history significant for sickle cell disease, asthma, ADHD who presents concern for sickle cell pain crisis that began yesterday.  He endorses some pain in the middle of his chest that moved to his upper back.  He reports difficulty sleeping.  He reports he is taken some Tylenol , muscle relaxer, he reports he does not have a regular sickle cell pain doctor or regular narcotic rx established.    Sickle Cell Pain Crisis      Prior to Admission medications   Medication Sig Start Date End Date Taking? Authorizing Provider  ondansetron  (ZOFRAN ) 4 MG tablet Take 1 tablet (4 mg total) by mouth every 8 (eight) hours as needed for nausea or vomiting. 12/29/23   Norrine Sharper, MD  oxyCODONE -acetaminophen  (PERCOCET/ROXICET) 5-325 MG tablet Take 1 tablet by mouth every 6 (six) hours as needed for severe pain (pain score 7-10). 07/15/24   Ilya Ess H, PA-C  traZODone  (DESYREL ) 50 MG tablet TAKE 1 TABLET(50 MG) BY MOUTH AT BEDTIME 05/01/24   Parsons, Brittney E, NP    Allergies: Patient has no known allergies.    Review of Systems  All other systems reviewed and are negative.   Updated Vital Signs BP (!) 137/93   Pulse 70   Temp 97.9 F (36.6 C) (Oral)   Resp 15   Ht 5' 10 (1.778 m)   Wt 58 kg   SpO2 100%   BMI 18.35 kg/m   Physical Exam Vitals and nursing note reviewed.  Constitutional:      General: He is not in acute distress.    Appearance: Normal appearance.  HENT:     Head: Normocephalic and atraumatic.  Eyes:     General:        Right eye: No discharge.        Left eye: No discharge.  Cardiovascular:     Rate and Rhythm: Normal rate and regular rhythm.     Heart sounds: No murmur heard.    No friction rub. No gallop.  Pulmonary:      Effort: Pulmonary effort is normal.     Breath sounds: Normal breath sounds.     Comments: No wheezing, rhonchi, stridor, rales Abdominal:     General: Bowel sounds are normal.     Palpations: Abdomen is soft.  Musculoskeletal:     Comments: Reproducible chest wall tenderness on palpation anteriorly  Skin:    General: Skin is warm and dry.     Capillary Refill: Capillary refill takes less than 2 seconds.  Neurological:     Mental Status: He is alert and oriented to person, place, and time.  Psychiatric:        Mood and Affect: Mood normal.        Behavior: Behavior normal.     (all labs ordered are listed, but only abnormal results are displayed) Labs Reviewed  COMPREHENSIVE METABOLIC PANEL WITH GFR - Abnormal; Notable for the following components:      Result Value   Potassium 3.2 (*)    Total Bilirubin 1.3 (*)    All other components within normal limits  CBC WITH DIFFERENTIAL/PLATELET - Abnormal; Notable for the following components:   MCV 69.6 (*)    MCH 22.8 (*)  Platelets 144 (*)    All other components within normal limits  RETICULOCYTES - Abnormal; Notable for the following components:   Immature Retic Fract 19.6 (*)    All other components within normal limits  TROPONIN I (HIGH SENSITIVITY)    EKG: EKG Interpretation Date/Time:  Monday July 15 2024 09:50:30 EDT Ventricular Rate:  64 PR Interval:  144 QRS Duration:  79 QT Interval:  384 QTC Calculation: 397 R Axis:   58  Text Interpretation: Sinus rhythm Confirmed by Zackowski, Scott [phone]) on 07/15/2024 10:02:52 AM  Radiology: [facility] Chest Portable 1 View Result Date: 07/15/2024 CLINICAL DATA:  Sickle cell crisis with mid chest pain that began yesterday. EXAM: PORTABLE CHEST 1 VIEW COMPARISON:  03/26/2024 FINDINGS: The heart size and mediastinal contours are within normal limits. Both lungs are clear. The visualized skeletal structures are unremarkable. IMPRESSION: No active disease. Electronically  Signed   By: Elspeth Bathe M.D.   On: 07/15/2024 10:00     Procedures   Medications Ordered in the ED  potassium chloride  SA (KLOR-CON  M) CR tablet 40 mEq (has no administration in time range)  HYDROmorphone  (DILAUDID ) injection 1 mg (1 mg Intravenous Given 07/15/24 0941)  HYDROmorphone  (DILAUDID ) injection 2 mg (2 mg Intravenous Given 07/15/24 1054)                                    Medical Decision Making Amount and/or Complexity of Data Reviewed Labs: ordered. Radiology: ordered.  Risk Prescription drug management.   This patient is a 23 y.o. male  who presents to the ED for concern of sickle cell pain crisis, chest pain.   Differential diagnoses prior to evaluation: The emergent differential diagnosis includes, but is not limited to,  ACS, AAS, PE, Mallory-Weiss, Boerhaave's, Pneumonia, acute bronchitis, asthma or COPD exacerbation, anxiety, MSK pain or traumatic injury to the chest, acid reflux versus other --vaso-occlusive crisis, acute chest syndrome, versus other chronic pain also considered. This is not an exhaustive differential.   Past Medical History / Co-morbidities / Social History: Sickle cell anemia  Additional history: Chart reviewed. Pertinent results include: Reviewed lab work, imaging from previous emergency department visits  Physical Exam: Physical exam performed. The pertinent findings include: Vital signs overall stable in emergency department, mild hypertension, blood pressure 137/93, no tachycardia, no hypoxia.  Clear breath sounds bilaterally. Reproducible chest wall tenderness on palpation anteriorly   Lab Tests/Imaging studies: I personally interpreted labs/imaging and the pertinent results include: CBC overall unremarkable other than microcytic quality without overt anemia, hemoglobin 13, platelets 144, otherwise unremarkable, normal troponin x 1 in context of atypical chest pain ongoing for greater than 6 hours, suspect related to his sickle cell  crisis.  CMP is notable for some hypokalemic potassium 3.2, we will orally replete, mild elevated total bilirubin 1.3 likely secondary to breakdown of blood cells.  His reticulocyte count is normal but with elevated immature reticulocytes consistent with some active sickling.  I independently interpreted plain film chest x-ray which shows no evidence of acute intrathoracic abnormality.  I agree with the radiologist interpretation.  Cardiac monitoring: EKG obtained and interpreted by myself and attending physician which shows: NSR   Medications: I ordered medication including Dilaudid  for pain, potassium for hypokalemia.  I have reviewed the patients home medicines and have made adjustments as needed.   Disposition: After consideration of the diagnostic results and the patients response to treatment, I feel that  patient needs to get established with sickle cell clinic as he has been seen multiple times in the emergency department does not seem to have any local sickle cell doctors, I placed a referral for him to see the sickle cell clinic physicians, discussed that overall would recommend that he seek treatment here over the emergency department for routine sickle cell pain unless he cannot be seen in this context, or is worried about vaso-occlusive crisis or complications, acute chest syndrome  emergency department workup does not suggest an emergent condition requiring admission or immediate intervention beyond what has been performed at this time. The plan is: as above. The patient is safe for discharge and has been instructed to return immediately for worsening symptoms, change in symptoms or any other concerns. .   Final diagnoses:  Sickle cell pain crisis [facility])    ED Discharge Orders          Ordered    oxyCODONE -acetaminophen  (PERCOCET/ROXICET) 5-325 MG tablet  Every 6 hours PRN        07/15/24 1204               Joslynn Jamroz, [place] H, PA-C 07/15/24 1208    Levander Houston,  MD 07/15/24 [phone]

## 2024-07-16 ENCOUNTER — Telehealth: Payer: Self-pay

## 2024-07-16 ENCOUNTER — Inpatient Hospital Stay (HOSPITAL_COMMUNITY)
Admission: EM | Admit: 2024-07-16 | Discharge: 2024-07-20 | DRG: 812 | Disposition: A | Discharge status: Home / Self Care | Payer: MEDICAID | Attending: Internal Medicine | Admitting: Internal Medicine

## 2024-07-16 ENCOUNTER — Encounter (HOSPITAL_COMMUNITY): Payer: Self-pay

## 2024-07-16 ENCOUNTER — Emergency Department (HOSPITAL_COMMUNITY): Payer: MEDICAID

## 2024-07-16 ENCOUNTER — Other Ambulatory Visit: Payer: Self-pay

## 2024-07-16 ENCOUNTER — Emergency Department (HOSPITAL_COMMUNITY)
Admission: EM | Admit: 2024-07-16 | Discharge: 2024-07-16 | Disposition: A | Discharge status: Home / Self Care | Payer: MEDICAID | Source: Home / Self Care | Attending: Emergency Medicine | Admitting: Emergency Medicine

## 2024-07-16 DIAGNOSIS — Z79899 Other long term (current) drug therapy: Secondary | ICD-10-CM

## 2024-07-16 DIAGNOSIS — G894 Chronic pain syndrome: Secondary | ICD-10-CM | POA: Insufficient documentation

## 2024-07-16 DIAGNOSIS — J45909 Unspecified asthma, uncomplicated: Secondary | ICD-10-CM | POA: Diagnosis present

## 2024-07-16 DIAGNOSIS — D57419 Sickle-cell thalassemia with crisis, unspecified: Secondary | ICD-10-CM | POA: Diagnosis present

## 2024-07-16 DIAGNOSIS — D5700 Hb-SS disease with crisis, unspecified: Principal | ICD-10-CM

## 2024-07-16 DIAGNOSIS — F909 Attention-deficit hyperactivity disorder, unspecified type: Secondary | ICD-10-CM | POA: Diagnosis present

## 2024-07-16 DIAGNOSIS — D57459 Sickle-cell thalassemia beta plus with crisis, unspecified: Principal | ICD-10-CM | POA: Diagnosis present

## 2024-07-16 DIAGNOSIS — F1290 Cannabis use, unspecified, uncomplicated: Secondary | ICD-10-CM | POA: Diagnosis present

## 2024-07-16 DIAGNOSIS — Z8249 Family history of ischemic heart disease and other diseases of the circulatory system: Secondary | ICD-10-CM

## 2024-07-16 DIAGNOSIS — F17290 Nicotine dependence, other tobacco product, uncomplicated: Secondary | ICD-10-CM | POA: Diagnosis present

## 2024-07-16 DIAGNOSIS — Z832 Family history of diseases of the blood and blood-forming organs and certain disorders involving the immune mechanism: Secondary | ICD-10-CM

## 2024-07-16 LAB — CBC WITH DIFFERENTIAL/PLATELET
Abs Immature Granulocytes: 0.03 K/uL (ref 0.00–0.07)
Basophils Absolute: 0 K/uL (ref 0.0–0.1)
Basophils Relative: 1 %
Eosinophils Absolute: 0.3 K/uL (ref 0.0–0.5)
Eosinophils Relative: 3 %
HCT: 36.6 % — ABNORMAL LOW (ref 39.0–52.0)
Hemoglobin: 12.2 g/dL — ABNORMAL LOW (ref 13.0–17.0)
Immature Granulocytes: 0 %
Lymphocytes Relative: 15 %
Lymphs Abs: 1.3 K/uL (ref 0.7–4.0)
MCH: 23.1 pg — ABNORMAL LOW (ref 26.0–34.0)
MCHC: 33.3 g/dL (ref 30.0–36.0)
MCV: 69.4 fL — ABNORMAL LOW (ref 80.0–100.0)
Monocytes Absolute: 0.7 K/uL (ref 0.1–1.0)
Monocytes Relative: 8 %
Neutro Abs: 6.3 K/uL (ref 1.7–7.7)
Neutrophils Relative %: 73 %
Platelets: 139 K/uL — ABNORMAL LOW (ref 150–400)
RBC: 5.27 MIL/uL (ref 4.22–5.81)
RDW: 15.6 % — ABNORMAL HIGH (ref 11.5–15.5)
WBC: 8.6 K/uL (ref 4.0–10.5)
nRBC: 0 % (ref 0.0–0.2)

## 2024-07-16 LAB — COMPREHENSIVE METABOLIC PANEL WITH GFR
ALT: 18 U/L (ref 0–44)
AST: 17 U/L (ref 15–41)
Albumin: 4.1 g/dL (ref 3.5–5.0)
Alkaline Phosphatase: 64 U/L (ref 38–126)
Anion gap: 7 (ref 5–15)
BUN: 9 mg/dL (ref 6–20)
CO2: 27 mmol/L (ref 22–32)
Calcium: 8.9 mg/dL (ref 8.9–10.3)
Chloride: 104 mmol/L (ref 98–111)
Creatinine, Ser: 0.72 mg/dL (ref 0.61–1.24)
GFR, Estimated: >60 mL/min (ref >60)
Glucose, Bld: 129 mg/dL — ABNORMAL HIGH (ref 70–99)
Potassium: 3.3 mmol/L — ABNORMAL LOW (ref 3.5–5.1)
Sodium: 138 mmol/L (ref 135–145)
Total Bilirubin: 1.3 mg/dL — ABNORMAL HIGH (ref 0.0–1.2)
Total Protein: 6.5 g/dL (ref 6.5–8.1)

## 2024-07-16 LAB — RETICULOCYTES
Immature Retic Fract: 12.9 % (ref 2.3–15.9)
RBC.: 5.26 MIL/uL (ref 4.22–5.81)
Retic Count, Absolute: 82.1 K/uL (ref 19.0–186.0)
Retic Ct Pct: 1.6 % (ref 0.4–3.1)

## 2024-07-16 LAB — PROTIME-INR
INR: 1.1 (ref 0.8–1.2)
Prothrombin Time: 14.5 s (ref 11.4–15.2)

## 2024-07-16 LAB — TROPONIN I (HIGH SENSITIVITY): Troponin I (High Sensitivity): 7 ng/L (ref <18)

## 2024-07-16 MED ORDER — KETOROLAC TROMETHAMINE 15 MG/ML IJ SOLN
15.0000 mg | INTRAMUSCULAR | Status: AC
  Administered 2024-07-16: 15 mg via INTRAVENOUS
  Filled 2024-07-16: qty 1

## 2024-07-16 MED ORDER — MORPHINE SULFATE 15 MG PO TABS
15.0000 mg | ORAL_TABLET | Freq: Once | ORAL | Status: AC
  Administered 2024-07-16: 15 mg via ORAL
  Filled 2024-07-16: qty 1

## 2024-07-16 MED ORDER — HYDROMORPHONE HCL 1 MG/ML IJ SOLN
2.0000 mg | INTRAMUSCULAR | Status: AC
  Administered 2024-07-16: 2 mg via INTRAVENOUS
  Filled 2024-07-16: qty 2

## 2024-07-16 MED ORDER — ONDANSETRON HCL 4 MG/2ML IJ SOLN
4.0000 mg | INTRAMUSCULAR | Status: DC | PRN
  Administered 2024-07-16: 4 mg via INTRAVENOUS
  Filled 2024-07-16: qty 2

## 2024-07-16 MED ORDER — DEXTROSE-SODIUM CHLORIDE 5-0.45 % IV SOLN: INTRAVENOUS | Status: DC

## 2024-07-16 MED ORDER — HYDROMORPHONE HCL 1 MG/ML IJ SOLN
2.0000 mg | Freq: Once | INTRAMUSCULAR | Status: AC
  Administered 2024-07-16: 2 mg via INTRAVENOUS
  Filled 2024-07-16: qty 2

## 2024-07-16 MED ORDER — OXYCODONE-ACETAMINOPHEN 5-325 MG PO TABS: 1.0000 | ORAL_TABLET | ORAL | 0 refills | Status: DC | PRN

## 2024-07-16 MED ORDER — MORPHINE SULFATE (PF) 4 MG/ML IV SOLN
6.0000 mg | INTRAVENOUS | Status: AC
  Administered 2024-07-16: 6 mg via INTRAVENOUS
  Filled 2024-07-16: qty 2

## 2024-07-16 MED ORDER — SODIUM CHLORIDE 0.45 % IV SOLN: INTRAVENOUS | Status: DC

## 2024-07-16 MED ORDER — MORPHINE SULFATE (PF) 4 MG/ML IV SOLN
8.0000 mg | INTRAVENOUS | Status: AC
  Administered 2024-07-16: 8 mg via INTRAVENOUS
  Filled 2024-07-16: qty 2

## 2024-07-16 NOTE — ED Provider Notes (Signed)
 [place] EMERGENCY DEPARTMENT AT [facility] Provider Note   CSN: [id number] Arrival date & time: 07/16/24  9384     Patient presents with: Sickle Cell Pain Crisis   Maurice Ferguson is a 23 y.o. male.   Patient complains of feeling like he is having a sickle cell crisis.  Patient reports that he was seen yesterday for the same.  Patient reports pain has not resolved.  Patient complains of discomfort in his chest.  Patient denies any fever he denies any chills he has not had any cough or congestion.  Patient reports soreness in his chest and his legs.  Patient does not currently have a primary care physician he is not on any medications for his sickle cell.  Patient has not taking anything at home for pain.  Because he does not have any pain medication.  The history is provided by the patient. No language interpreter was used.  Sickle Cell Pain Crisis Associated symptoms: chest pain        Prior to Admission medications   Medication Sig Start Date End Date Taking? Authorizing Provider  oxyCODONE -acetaminophen  (PERCOCET) 5-325 MG tablet Take 1 tablet by mouth every 4 (four) hours as needed for severe pain (pain score 7-10). 07/16/24 07/16/25 Yes Cherise Fedder K, PA-C  ondansetron  (ZOFRAN ) 4 MG tablet Take 1 tablet (4 mg total) by mouth every 8 (eight) hours as needed for nausea or vomiting. 12/29/23   Norrine Sharper, MD  traZODone  (DESYREL ) 50 MG tablet TAKE 1 TABLET(50 MG) BY MOUTH AT BEDTIME 05/01/24   Parsons, Brittney E, NP    Allergies: Patient has no known allergies.    Review of Systems  Cardiovascular:  Positive for chest pain.  All other systems reviewed and are negative.   Updated Vital Signs BP 131/81   Pulse 72   Temp 97.8 F (36.6 C) (Oral)   Resp (!) 23   SpO2 100%   Physical Exam Vitals and nursing note reviewed.  Constitutional:      Appearance: He is well-developed.  HENT:     Head: Normocephalic.     Right Ear: External ear normal.     Left  Ear: External ear normal.     Mouth/Throat:     Mouth: Mucous membranes are moist.  Cardiovascular:     Rate and Rhythm: Normal rate.  Pulmonary:     Effort: Pulmonary effort is normal.  Abdominal:     General: Abdomen is flat. There is no distension.  Musculoskeletal:        General: Normal range of motion.     Cervical back: Normal range of motion.  Skin:    General: Skin is warm.  Neurological:     General: No focal deficit present.     Mental Status: He is alert and oriented to person, place, and time.   Yesterday you will  (all labs ordered are listed, but only abnormal results are displayed) Labs Reviewed  COMPREHENSIVE METABOLIC PANEL WITH GFR - Abnormal; Notable for the following components:      Result Value   Potassium 3.3 (*)    Glucose, Bld 129 (*)    Total Bilirubin 1.3 (*)    All other components within normal limits  CBC WITH DIFFERENTIAL/PLATELET - Abnormal; Notable for the following components:   Hemoglobin 12.2 (*)    HCT 36.6 (*)    MCV 69.4 (*)    MCH 23.1 (*)    RDW 15.6 (*)    Platelets 139 (*)  All other components within normal limits  RETICULOCYTES  PROTIME-INR    EKG: EKG Interpretation Date/Time:  Tuesday July 16 2024 06:23:23 EDT Ventricular Rate:  72 PR Interval:  131 QRS Duration:  75 QT Interval:  362 QTC Calculation: 397 R Axis:   59  Text Interpretation: Sinus rhythm ST elev, probable normal early repol pattern Confirmed by Melvenia Motto [phone]) on 07/16/2024 6:44:46 AM  Radiology: [facility] Chest 2 View Result Date: 07/16/2024 CLINICAL DATA:  Shortness of breath EXAM: CHEST - 2 VIEW COMPARISON:  07/15/2024 FINDINGS: The lungs are clear without focal pneumonia, edema, pneumothorax or pleural effusion. The cardiopericardial silhouette is within normal limits for size. No acute bony abnormality. Telemetry leads overlie the chest. IMPRESSION: No active cardiopulmonary disease. Electronically Signed   By: Camellia Candle M.D.   On: 07/16/2024  07:05   DG Chest Portable 1 View Result Date: 07/15/2024 CLINICAL DATA:  Sickle cell crisis with mid chest pain that began yesterday. EXAM: PORTABLE CHEST 1 VIEW COMPARISON:  03/26/2024 FINDINGS: The heart size and mediastinal contours are within normal limits. Both lungs are clear. The visualized skeletal structures are unremarkable. IMPRESSION: No active disease. Electronically Signed   By: Elspeth Bathe M.D.   On: 07/15/2024 10:00     Procedures   Medications Ordered in the ED  0.45 % sodium chloride  infusion (0 mLs Intravenous Stopped 07/16/24 1112)  ondansetron  (ZOFRAN ) injection 4 mg (4 mg Intravenous Given 07/16/24 0733)  ketorolac  (TORADOL ) 15 MG/ML injection 15 mg (15 mg Intravenous Given 07/16/24 0733)  HYDROmorphone  (DILAUDID ) injection 2 mg (2 mg Intravenous Given 07/16/24 0734)  HYDROmorphone  (DILAUDID ) injection 2 mg (2 mg Intravenous Given 07/16/24 0926)                                    Medical Decision Making Patient complains of a sickle cell crisis.  Amount and/or Complexity of Data Reviewed Labs: ordered. Decision-making details documented in ED Course.    Details: Labs ordered reviewed and interpreted.  Patient's hemoglobin is 12.2.  Potassium is 3.3.  Retake count is normal. Radiology: ordered and independent interpretation performed. Decision-making details documented in ED Course.    Details: Chest x-ray shows no acute cardiopulmonary disease ECG/medicine tests: ordered and independent interpretation performed. Decision-making details documented in ED Course.    Details: EKG normal sinus no acute changes  Risk Prescription drug management. Risk Details: Patient given 2 doses of IV Dilaudid  and Toradol .  Patient advised he should follow-up with the sickle cell clinic.  And establish a primary care provider at the sickle cell clinic.  Patient does not have any findings consistent with acute chest.  Patient symptoms seem to be secondary to chronic pain rather than  sickle cell crisis.  Patient is given a prescription for Percocet.  He is advised to return if any problems.        Final diagnoses:  Chronic pain syndrome    ED Discharge Orders          Ordered    oxyCODONE -acetaminophen  (PERCOCET) 5-325 MG tablet  Every 4 hours PRN        07/16/24 1056            An After Visit Summary was printed and given to the patient.    [place] Sonny POUR, PA-C 07/16/24 1501    Patt Alm Macho, MD 07/16/24 [phone]

## 2024-07-16 NOTE — ED Triage Notes (Signed)
 Pt. Arrives for [facility] that started yesterday. States that he was seen yesterday and given a prescription for pain meds, but they are not working. Reports pain in his chest and back with SOB.

## 2024-07-16 NOTE — ED Provider Triage Note (Signed)
 Emergency Medicine Provider Triage Evaluation Note  Maurice Ferguson , a 23 y.o. male  was evaluated in triage.  Pt complains of ongoing sickle cell pain. Third visit in 2 days. Was discharged by myself after last crisis with oxycodone  but reports no improvement, is supposed to follow up with sickle cell clinic on Aug 1. Discharged today without pain being appropriately controlled per patient.  Review of Systems  Positive: Chest pain, back pain Negative:   Physical Exam  BP (!) 132/94 (BP Location: Right Arm)   Pulse (!) 59   Temp 98.8 F (37.1 C) (Oral)   Resp 16   SpO2 100%  Gen:   Awake, no distress   Resp:  Normal effort  MSK:   Moves extremities without difficulty  Other:    Medical Decision Making  Medically screening exam initiated at 5:19 PM.  Appropriate orders placed.  Maurice Ferguson was informed that the remainder of the evaluation will be completed by another provider, this initial triage assessment does not replace that evaluation, and the importance of remaining in the ED until their evaluation is complete.  Workup initiated in triage    Maurice Ferguson, [place] 07/16/24 1720

## 2024-07-16 NOTE — ED Provider Notes (Signed)
 [place] [facility] Provider Note   CSN: [id number] Arrival date & time: 07/16/24  1554     Patient presents with: Sickle Cell Pain Crisis   Maurice Ferguson is a 23 y.o. male who presents emergency department chief complaint of sickle cell pain crisis.  He is complaining of chest pain that radiates into his back.  Patient has not been seen 3 times in the last 24 hours.  He had a workup for the same complaint yesterday with negative troponin and chest x-ray.  Patient was seen this morning for the same complaint.  He reports that each time he came to the ER he was given pain medication which helped his symptoms, discharge sent home and then the oral pain medication he was prescribed yesterday did not work to help with pain so he returns again.  Patient does not have follow-up care at this time.  He had made an appointment with his sickle cell clinic but his first apartment is not until August.  He was seen at bedside by our nurse case manager, Apolinar Gosling who is working to try to get him a closer appointment.  Patient otherwise does not come to the emergency department frequently for sickle cell pain crisis and last time he was here was back in April of this year.  He does not take narcotics regularly   {Add pertinent medical, surgical, social history, OB history to YEP:67052}  Sickle Cell Pain Crisis      Prior to Admission medications   Medication Sig Start Date End Date Taking? Authorizing Provider  ondansetron  (ZOFRAN ) 4 MG tablet Take 1 tablet (4 mg total) by mouth every 8 (eight) hours as needed for nausea or vomiting. 12/29/23   Norrine Sharper, MD  oxyCODONE -acetaminophen  (PERCOCET) 5-325 MG tablet Take 1 tablet by mouth every 4 (four) hours as needed for severe pain (pain score 7-10). 07/16/24 07/16/25  Flint Sonny POUR, PA-C  traZODone  (DESYREL ) 50 MG tablet TAKE 1 TABLET(50 MG) BY MOUTH AT BEDTIME 05/01/24   Parsons, Brittney E, NP    Allergies:  Patient has no known allergies.    Review of Systems  Updated Vital Signs BP 131/85 (BP Location: Right Arm)   Pulse 60   Temp 98.6 F (37 C) (Oral)   Resp 18   SpO2 100%   Physical Exam Vitals and nursing note reviewed.  Constitutional:      General: He is not in acute distress.    Appearance: He is well-developed. He is not diaphoretic.  HENT:     Head: Normocephalic and atraumatic.  Eyes:     General: No scleral icterus.    Conjunctiva/sclera: Conjunctivae normal.  Cardiovascular:     Rate and Rhythm: Normal rate and regular rhythm.     Heart sounds: Normal heart sounds.  Pulmonary:     Effort: Pulmonary effort is normal. No respiratory distress.     Breath sounds: Normal breath sounds.  Abdominal:     Palpations: Abdomen is soft.     Tenderness: There is no abdominal tenderness.  Musculoskeletal:     Cervical back: Normal range of motion and neck supple.  Skin:    General: Skin is warm and dry.  Neurological:     Mental Status: He is alert.  Psychiatric:        Behavior: Behavior normal.    PDMP reviewed during this encounter.   (all labs ordered are listed, but only abnormal results are displayed) Labs Reviewed -  No data to display  Results for orders placed or performed during the hospital encounter of 07/16/24  Comprehensive metabolic panel   Collection Time: 07/16/24  6:32 AM  Result Value Ref Range   Sodium 138 135 - 145 mmol/L   Potassium 3.3 (L) 3.5 - 5.1 mmol/L   Chloride 104 98 - 111 mmol/L   CO2 27 22 - 32 mmol/L   Glucose, Bld 129 (H) 70 - 99 mg/dL   BUN 9 6 - 20 mg/dL   Creatinine, Ser 9.27 0.61 - 1.24 mg/dL   Calcium 8.9 8.9 - 89.6 mg/dL   Total Protein 6.5 6.5 - 8.1 g/dL   Albumin 4.1 3.5 - 5.0 g/dL   AST 17 15 - 41 U/L   ALT 18 0 - 44 U/L   Alkaline Phosphatase 64 38 - 126 U/L   Total Bilirubin 1.3 (H) 0.0 - 1.2 mg/dL   GFR, Estimated >39 >39 mL/min   Anion gap 7 5 - 15  CBC with Differential   Collection Time: 07/16/24  6:32 AM   Result Value Ref Range   WBC 8.6 4.0 - 10.5 K/uL   RBC 5.27 4.22 - 5.81 MIL/uL   Hemoglobin 12.2 (L) 13.0 - 17.0 g/dL   HCT 63.3 (L) 60.9 - 47.9 %   MCV 69.4 (L) 80.0 - 100.0 fL   MCH 23.1 (L) 26.0 - 34.0 pg   MCHC 33.3 30.0 - 36.0 g/dL   RDW 84.3 (H) 88.4 - 84.4 %   Platelets 139 (L) 150 - 400 K/uL   nRBC 0.0 0.0 - 0.2 %   Neutrophils Relative % 73 %   Neutro Abs 6.3 1.7 - 7.7 K/uL   Lymphocytes Relative 15 %   Lymphs Abs 1.3 0.7 - 4.0 K/uL   Monocytes Relative 8 %   Monocytes Absolute 0.7 0.1 - 1.0 K/uL   Eosinophils Relative 3 %   Eosinophils Absolute 0.3 0.0 - 0.5 K/uL   Basophils Relative 1 %   Basophils Absolute 0.0 0.0 - 0.1 K/uL   Immature Granulocytes 0 %   Abs Immature Granulocytes 0.03 0.00 - 0.07 K/uL  Reticulocytes   Collection Time: 07/16/24  6:32 AM  Result Value Ref Range   Retic Ct Pct 1.6 0.4 - 3.1 %   RBC. 5.26 4.22 - 5.81 MIL/uL   Retic Count, Absolute 82.1 19.0 - 186.0 K/uL   Immature Retic Fract 12.9 2.3 - 15.9 %  Protime-INR   Collection Time: 07/16/24  7:11 AM  Result Value Ref Range   Prothrombin Time 14.5 11.4 - 15.2 seconds   INR 1.1 0.8 - 1.2    EKG: None  Radiology: DG Chest 2 View Result Date: 07/16/2024 CLINICAL DATA:  Shortness of breath EXAM: CHEST - 2 VIEW COMPARISON:  07/15/2024 FINDINGS: The lungs are clear without focal pneumonia, edema, pneumothorax or pleural effusion. The cardiopericardial silhouette is within normal limits for size. No acute bony abnormality. Telemetry leads overlie the chest. IMPRESSION: No active cardiopulmonary disease. Electronically Signed   By: Camellia Candle M.D.   On: 07/16/2024 07:05   DG Chest Portable 1 View Result Date: 07/15/2024 CLINICAL DATA:  Sickle cell crisis with mid chest pain that began yesterday. EXAM: PORTABLE CHEST 1 VIEW COMPARISON:  03/26/2024 FINDINGS: The heart size and mediastinal contours are within normal limits. Both lungs are clear. The visualized skeletal structures are  unremarkable. IMPRESSION: No active disease. Electronically Signed   By: Elspeth Bathe M.D.   On: 07/15/2024 10:00    {  Document cardiac monitor, telemetry assessment procedure when appropriate:32947} Procedures   Medications Ordered in the ED - No data to display    {Click here for ABCD2, HEART and other calculators REFRESH Note before signing:1}                              Medical Decision Making Risk Prescription drug management.   ***  {Document critical care time when appropriate  Document review of labs and clinical decision tools ie CHADS2VASC2, etc  Document your independent review of radiology images and any outside records  Document your discussion with family members, caretakers and with consultants  Document social determinants of health affecting pt's care  Document your decision making why or why not admission, treatments were needed:32947:::1}   Final diagnoses:  None    ED Discharge Orders     None

## 2024-07-16 NOTE — Discharge Instructions (Addendum)
 Establish care with a primary care provider

## 2024-07-16 NOTE — ED Triage Notes (Signed)
 Pt reports [facility]. Pt reports his home medication is not working. Pt seen and [facility] this morning for same.

## 2024-07-16 NOTE — ED Notes (Addendum)
 Patient transported to X-ray

## 2024-07-16 NOTE — Telephone Encounter (Signed)
 Patient advised that because he has not established care, there is not provider to route a refill request.   Encouraged to keep scheduled appointment for 07/26/2024 and return to ED if symptoms return.   He expresses frustration, but verbalizes understanding

## 2024-07-16 NOTE — Care Management (Signed)
 ED RNCM met with patient in [facility] bed 18  to discuss frequent ED visits.  Patient reports he is unable to manage his Sickle Cell pain at home. Patient was seen at [facility] this morning due to increase pain. Patient states he has an appt to establish care at the [facility], but his appointment is not until 8/1. Discussed notifying the [facility] to see if he can be placed on the will call list for a more immediate appt. and to establish care. Sent a message to Bascom Borer NP to possibly assist with this issue.  Updated the patient and the EDP. ED RNCM will follow-up.

## 2024-07-16 NOTE — Telephone Encounter (Unsigned)
 Copied from CRM [id number]. Topic: Clinical - Medication Refill >> Jul 16, 2024 12:21 PM Tiffini S wrote: Medication: oxyCODONE -acetaminophen  (PERCOCET) 5-325 MG tablet    Has the patient contacted their pharmacy? Yes, patient was seen in the ED and was told to pick up medication today- pharmacy said not ready  (Agent: If no, request that the patient contact the pharmacy for the refill. If patient does not wish to contact the pharmacy document the reason why and proceed with request.) (Agent: If yes, when and what did the pharmacy advise?)  This is the patient's preferred pharmacy:  [facility] [address], [place] - [address] AT [address] [place] [address] Phone: [phone] Fax: [phone]    Is this the correct pharmacy for this prescription? Yes If no, delete pharmacy and type the correct one.   Has the prescription been filled recently? No  Is the patient out of the medication? Yes  Has the patient been seen for an appointment in the last year OR does the patient have an upcoming appointment? No  Can we respond through MyChart? No, patient please call [phone]  Agent: Please be advised that Rx refills may take up to 3 business days. We ask that you follow-up with your pharmacy.

## 2024-07-17 DIAGNOSIS — G894 Chronic pain syndrome: Secondary | ICD-10-CM | POA: Diagnosis present

## 2024-07-17 DIAGNOSIS — D57419 Sickle-cell thalassemia with crisis, unspecified: Secondary | ICD-10-CM | POA: Diagnosis present

## 2024-07-17 DIAGNOSIS — D5700 Hb-SS disease with crisis, unspecified: Secondary | ICD-10-CM | POA: Diagnosis present

## 2024-07-17 DIAGNOSIS — F17290 Nicotine dependence, other tobacco product, uncomplicated: Secondary | ICD-10-CM | POA: Diagnosis present

## 2024-07-17 DIAGNOSIS — R531 Weakness: Secondary | ICD-10-CM | POA: Diagnosis not present

## 2024-07-17 DIAGNOSIS — F909 Attention-deficit hyperactivity disorder, unspecified type: Secondary | ICD-10-CM | POA: Diagnosis present

## 2024-07-17 DIAGNOSIS — Z8249 Family history of ischemic heart disease and other diseases of the circulatory system: Secondary | ICD-10-CM | POA: Diagnosis not present

## 2024-07-17 DIAGNOSIS — F1290 Cannabis use, unspecified, uncomplicated: Secondary | ICD-10-CM | POA: Diagnosis present

## 2024-07-17 DIAGNOSIS — Z79899 Other long term (current) drug therapy: Secondary | ICD-10-CM | POA: Diagnosis not present

## 2024-07-17 DIAGNOSIS — D57459 Sickle-cell thalassemia beta plus with crisis, unspecified: Secondary | ICD-10-CM | POA: Diagnosis present

## 2024-07-17 DIAGNOSIS — Z743 Need for continuous supervision: Secondary | ICD-10-CM | POA: Diagnosis not present

## 2024-07-17 DIAGNOSIS — J45909 Unspecified asthma, uncomplicated: Secondary | ICD-10-CM | POA: Diagnosis present

## 2024-07-17 DIAGNOSIS — Z832 Family history of diseases of the blood and blood-forming organs and certain disorders involving the immune mechanism: Secondary | ICD-10-CM | POA: Diagnosis not present

## 2024-07-17 LAB — HEPATIC FUNCTION PANEL
ALT: 35 U/L (ref 0–44)
AST: 28 U/L (ref 15–41)
Albumin: 3.8 g/dL (ref 3.5–5.0)
Alkaline Phosphatase: 77 U/L (ref 38–126)
Bilirubin, Direct: 0.4 mg/dL — ABNORMAL HIGH (ref 0.0–0.2)
Indirect Bilirubin: 1.8 mg/dL — ABNORMAL HIGH (ref 0.3–0.9)
Total Bilirubin: 2.2 mg/dL — ABNORMAL HIGH (ref 0.0–1.2)
Total Protein: 6.5 g/dL (ref 6.5–8.1)

## 2024-07-17 LAB — CBC WITH DIFFERENTIAL/PLATELET
Abs Immature Granulocytes: 0.02 K/uL (ref 0.00–0.07)
Basophils Absolute: 0 K/uL (ref 0.0–0.1)
Basophils Relative: 1 %
Eosinophils Absolute: 0.6 K/uL — ABNORMAL HIGH (ref 0.0–0.5)
Eosinophils Relative: 9 %
HCT: 29.8 % — ABNORMAL LOW (ref 39.0–52.0)
Hemoglobin: 10.2 g/dL — ABNORMAL LOW (ref 13.0–17.0)
Immature Granulocytes: 0 %
Lymphocytes Relative: 29 %
Lymphs Abs: 1.7 K/uL (ref 0.7–4.0)
MCH: 23.4 pg — ABNORMAL LOW (ref 26.0–34.0)
MCHC: 34.2 g/dL (ref 30.0–36.0)
MCV: 68.5 fL — ABNORMAL LOW (ref 80.0–100.0)
Monocytes Absolute: 0.5 K/uL (ref 0.1–1.0)
Monocytes Relative: 8 %
Neutro Abs: 3.2 K/uL (ref 1.7–7.7)
Neutrophils Relative %: 53 %
Platelets: 113 K/uL — ABNORMAL LOW (ref 150–400)
RBC: 4.35 MIL/uL (ref 4.22–5.81)
RDW: 14.9 % (ref 11.5–15.5)
WBC: 6 K/uL (ref 4.0–10.5)
nRBC: 0 % (ref 0.0–0.2)

## 2024-07-17 LAB — BASIC METABOLIC PANEL WITH GFR
Anion gap: 9 (ref 5–15)
BUN: 8 mg/dL (ref 6–20)
CO2: 26 mmol/L (ref 22–32)
Calcium: 8.7 mg/dL — ABNORMAL LOW (ref 8.9–10.3)
Chloride: 103 mmol/L (ref 98–111)
Creatinine, Ser: 0.83 mg/dL (ref 0.61–1.24)
GFR, Estimated: >60 mL/min (ref >60)
Glucose, Bld: 98 mg/dL (ref 70–99)
Potassium: 3.6 mmol/L (ref 3.5–5.1)
Sodium: 138 mmol/L (ref 135–145)

## 2024-07-17 LAB — CBC
HCT: 35.3 % — ABNORMAL LOW (ref 39.0–52.0)
Hemoglobin: 11.6 g/dL — ABNORMAL LOW (ref 13.0–17.0)
MCH: 22.5 pg — ABNORMAL LOW (ref 26.0–34.0)
MCHC: 32.9 g/dL (ref 30.0–36.0)
MCV: 68.4 fL — ABNORMAL LOW (ref 80.0–100.0)
Platelets: 138 K/uL — ABNORMAL LOW (ref 150–400)
RBC: 5.16 MIL/uL (ref 4.22–5.81)
RDW: 14.9 % (ref 11.5–15.5)
WBC: 6.8 K/uL (ref 4.0–10.5)
nRBC: 0 % (ref 0.0–0.2)

## 2024-07-17 LAB — MAGNESIUM: Magnesium: 2.1 mg/dL (ref 1.7–2.4)

## 2024-07-17 LAB — TECHNOLOGIST SMEAR REVIEW

## 2024-07-17 LAB — MRSA NEXT GEN BY PCR, NASAL: MRSA by PCR Next Gen: NOT DETECTED

## 2024-07-17 LAB — PHOSPHORUS: Phosphorus: 3.4 mg/dL (ref 2.5–4.6)

## 2024-07-17 MED ORDER — SODIUM CHLORIDE 0.9% FLUSH: 9.0000 mL | INTRAVENOUS | Status: DC | PRN

## 2024-07-17 MED ORDER — KETOROLAC TROMETHAMINE 30 MG/ML IJ SOLN
15.0000 mg | Freq: Four times a day (QID) | INTRAMUSCULAR | Status: DC
  Administered 2024-07-17 – 2024-07-20 (×12): 15 mg via INTRAVENOUS
  Filled 2024-07-17 (×12): qty 1

## 2024-07-17 MED ORDER — ORAL CARE MOUTH RINSE: 15.0000 mL | OROMUCOSAL | Status: DC | PRN

## 2024-07-17 MED ORDER — POLYETHYLENE GLYCOL 3350 17 G PO PACK
17.0000 g | PACK | Freq: Every day | ORAL | Status: DC | PRN
  Administered 2024-07-18 – 2024-07-19 (×2): 17 g via ORAL
  Filled 2024-07-17 (×3): qty 1

## 2024-07-17 MED ORDER — SODIUM CHLORIDE 0.9% FLUSH
3.0000 mL | Freq: Two times a day (BID) | INTRAVENOUS | Status: DC
  Administered 2024-07-17 – 2024-07-19 (×6): 3 mL via INTRAVENOUS

## 2024-07-17 MED ORDER — SODIUM CHLORIDE 0.9 % IV SOLN: INTRAVENOUS | Status: DC | PRN

## 2024-07-17 MED ORDER — NALOXONE HCL 0.4 MG/ML IJ SOLN: 0.4000 mg | INTRAMUSCULAR | Status: DC | PRN

## 2024-07-17 MED ORDER — DICLOFENAC SODIUM 1 % EX GEL: 4.0000 g | Freq: Four times a day (QID) | CUTANEOUS | Status: DC | PRN

## 2024-07-17 MED ORDER — ONDANSETRON HCL 4 MG/2ML IJ SOLN: 4.0000 mg | Freq: Four times a day (QID) | INTRAMUSCULAR | Status: DC | PRN

## 2024-07-17 MED ORDER — ALBUTEROL SULFATE (2.5 MG/3ML) 0.083% IN NEBU: 2.5000 mg | INHALATION_SOLUTION | RESPIRATORY_TRACT | Status: DC | PRN

## 2024-07-17 MED ORDER — CHLORHEXIDINE GLUCONATE CLOTH 2 % EX PADS
6.0000 | MEDICATED_PAD | Freq: Every day | CUTANEOUS | Status: DC
  Administered 2024-07-18: 6 via TOPICAL

## 2024-07-17 MED ORDER — ENOXAPARIN SODIUM 40 MG/0.4ML IJ SOSY
40.0000 mg | PREFILLED_SYRINGE | Freq: Every day | INTRAMUSCULAR | Status: DC
  Filled 2024-07-17: qty 0.4

## 2024-07-17 MED ORDER — MELATONIN 3 MG PO TABS
6.0000 mg | ORAL_TABLET | Freq: Every evening | ORAL | Status: DC | PRN
  Administered 2024-07-18: 6 mg via ORAL
  Filled 2024-07-17: qty 2

## 2024-07-17 MED ORDER — HYDROMORPHONE HCL 1 MG/ML IJ SOLN
0.5000 mg | INTRAMUSCULAR | Status: DC | PRN
  Administered 2024-07-17 (×2): 0.5 mg via INTRAVENOUS
  Filled 2024-07-17 (×2): qty 1

## 2024-07-17 MED ORDER — HYDROMORPHONE 1 MG/ML IV SOLN
INTRAVENOUS | Status: DC
  Administered 2024-07-17: 0.9 mg via INTRAVENOUS
  Administered 2024-07-17: 5 mg via INTRAVENOUS
  Administered 2024-07-18: 30 mg via INTRAVENOUS
  Administered 2024-07-19: 5.5 mg via INTRAVENOUS
  Administered 2024-07-19: 4.5 mg via INTRAVENOUS
  Administered 2024-07-19: 7 mg via INTRAVENOUS
  Administered 2024-07-19: 30 mg via INTRAVENOUS
  Administered 2024-07-20: 3 mg via INTRAVENOUS
  Administered 2024-07-20: 3.5 mg via INTRAVENOUS
  Administered 2024-07-20: 1 mg via INTRAVENOUS
  Administered 2024-07-20: 2.5 mg via INTRAVENOUS
  Filled 2024-07-17 (×2): qty 30

## 2024-07-17 MED ORDER — DIPHENHYDRAMINE HCL 25 MG PO CAPS
25.0000 mg | ORAL_CAPSULE | ORAL | Status: DC | PRN
  Administered 2024-07-17 – 2024-07-19 (×3): 25 mg via ORAL
  Filled 2024-07-17 (×3): qty 1

## 2024-07-17 MED ORDER — FOLIC ACID 1 MG PO TABS
1.0000 mg | ORAL_TABLET | Freq: Every day | ORAL | Status: DC
  Administered 2024-07-17 – 2024-07-20 (×4): 1 mg via ORAL
  Filled 2024-07-17 (×4): qty 1

## 2024-07-17 MED ORDER — OXYCODONE-ACETAMINOPHEN 5-325 MG PO TABS
1.0000 | ORAL_TABLET | ORAL | Status: DC | PRN
  Administered 2024-07-17 – 2024-07-20 (×16): 1 via ORAL
  Filled 2024-07-17 (×18): qty 1

## 2024-07-17 MED ORDER — KETOROLAC TROMETHAMINE 30 MG/ML IJ SOLN
30.0000 mg | Freq: Four times a day (QID) | INTRAMUSCULAR | Status: DC | PRN
  Administered 2024-07-17 (×2): 30 mg via INTRAVENOUS
  Filled 2024-07-17 (×2): qty 1

## 2024-07-17 MED ORDER — LIDOCAINE 5 % EX PTCH: 2.0000 | MEDICATED_PATCH | Freq: Every day | CUTANEOUS | Status: DC | PRN

## 2024-07-17 MED ORDER — HYDROMORPHONE 1 MG/ML IV SOLN
INTRAVENOUS | Status: DC
  Administered 2024-07-17: 30 mg via INTRAVENOUS
  Administered 2024-07-17: 0.6 mg via INTRAVENOUS
  Filled 2024-07-17: qty 30

## 2024-07-17 MED ORDER — ACETAMINOPHEN 500 MG PO TABS
1000.0000 mg | ORAL_TABLET | Freq: Four times a day (QID) | ORAL | Status: DC | PRN
  Administered 2024-07-18: 1000 mg via ORAL
  Filled 2024-07-17 (×2): qty 2

## 2024-07-17 NOTE — Plan of Care (Signed)
  Problem: Education: Goal: Knowledge of vaso-occlusive preventative measures will improve Outcome: Progressing   Problem: Fluid Volume: Goal: Ability to maintain a balanced intake and output will improve Outcome: Progressing   Problem: Sensory: Goal: Pain level will decrease with appropriate interventions Outcome: Progressing   Problem: Health Behavior/Discharge Planning: Goal: Ability to manage health-related needs will improve Outcome: Progressing

## 2024-07-17 NOTE — ED Notes (Signed)
 [facility] CALLED FOR TRANSPORT TO [facility].

## 2024-07-17 NOTE — Progress Notes (Signed)
 Patient ID: Maurice Ferguson, male   DOB: Mar 10, 2001, 23 y.o.   MRN: [id number] Subjective: Maurice Ferguson is a 23 y.o. 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  Patient reports pain in bilateral lower legs.  He had a recent prescription for from the emergency department for Percocet which did not help his pain.  Patient is endorsing pain of 7/10 today.  He has no new concerns.  He denies fever, vomiting, nausea, diarrhea, fever, shortness of breath.  No urinary symptoms.  Objective:  Vital signs in last 24 hours:  Vitals:   07/17/24 1108 07/17/24 1200 07/17/24 1206 07/17/24 1300  BP:   123/80   Pulse:  70 69 63  Resp: 13 17 13 12   Temp:   97.8 F (36.6 C)   TempSrc:   Oral   SpO2: 100% 100% 100% 100%  Weight:      Height:        Intake/Output from previous day:   Intake/Output Summary (Last 24 hours) at 07/17/2024 1537 Last data filed at 07/17/2024 1200 Gross per 24 hour  Intake 735.88 ml  Output 650 ml  Net 85.88 ml    Physical Exam: General: Alert, awake, oriented x3, in no acute distress.  HEENT: [place]/AT PEERL, EOMI Neck: Trachea midline,  no masses, no thyromegal,y no JVD, no carotid bruit OROPHARYNX:  Moist, No exudate/ erythema/lesions.  Heart: Regular rate and rhythm, without murmurs, rubs, gallops, PMI non-displaced, no heaves or thrills on palpation.  Lungs: Clear to auscultation, no wheezing or rhonchi noted. No increased vocal fremitus resonant to percussion  Abdomen: Soft, nontender, nondistended, positive bowel sounds, no masses no hepatosplenomegaly noted..  Neuro: No focal neurological deficits noted cranial nerves II through XII grossly intact. DTRs 2+ bilaterally upper and lower extremities. Strength 5 out of 5 in bilateral upper and lower extremities. Musculoskeletal: Bilateral lower extremity tenderness.   Psychiatric: Patient alert and oriented x3, good  insight and cognition, good recent to remote recall. Lymph node survey: No cervical axillary or inguinal lymphadenopathy noted.  Lab Results:  Basic Metabolic Panel:    Component Value Date/Time   NA 138 07/17/2024 0452   K 3.6 07/17/2024 0452   CL 103 07/17/2024 0452   CO2 26 07/17/2024 0452   BUN 8 07/17/2024 0452   CREATININE 0.83 07/17/2024 0452   GLUCOSE 98 07/17/2024 0452   CALCIUM 8.7 (L) 07/17/2024 0452   CBC:    Component Value Date/Time   WBC 6.8 07/17/2024 0452   HGB 11.6 (L) 07/17/2024 0452   HCT 35.3 (L) 07/17/2024 0452   PLT 138 (L) 07/17/2024 0452   MCV 68.4 (L) 07/17/2024 0452   NEUTROABS 3.2 07/17/2024 0021   LYMPHSABS 1.7 07/17/2024 0021   MONOABS 0.5 07/17/2024 0021   EOSABS 0.6 (H) 07/17/2024 0021   BASOSABS 0.0 07/17/2024 0021    Recent Results (from the past 240 hours)  MRSA Next Gen by PCR, Nasal     Status: None   Collection Time: 07/17/24 12:20 PM   Specimen: Nasal Mucosa; Nasal Swab  Result Value Ref Range Status   MRSA by PCR Next Gen NOT DETECTED NOT DETECTED Final    Comment: (NOTE) The GeneXpert MRSA Assay (FDA approved for NASAL specimens only), is one component of a comprehensive MRSA colonization surveillance program. It is not intended to diagnose MRSA infection nor to guide or monitor treatment for MRSA infections. Test performance  is not FDA approved in patients less than 50 years old. Performed at [facility], [address] [place], [place] [address]     Studies/Results: DG Chest 2 View Result Date: 07/16/2024 CLINICAL DATA:  Shortness of breath EXAM: CHEST - 2 VIEW COMPARISON:  07/15/2024 FINDINGS: The lungs are clear without focal pneumonia, edema, pneumothorax or pleural effusion. The cardiopericardial silhouette is within normal limits for size. No acute bony abnormality. Telemetry leads overlie the chest. IMPRESSION: No active cardiopulmonary disease. Electronically Signed   By: Camellia Candle M.D.   On:  07/16/2024 07:05    Medications: Scheduled Meds:  Chlorhexidine  Gluconate Cloth  6 each Topical Daily   enoxaparin  (LOVENOX ) injection  40 mg Subcutaneous QHS   folic acid   1 mg Oral Daily   HYDROmorphone    Intravenous Q4H   ketorolac   15 mg Intravenous Q6H   sodium chloride  flush  3 mL Intravenous Q12H   Continuous Infusions:   PRN Meds:.acetaminophen , albuterol , diclofenac  Sodium, diphenhydrAMINE , lidocaine , melatonin, naloxone  **AND** sodium chloride  flush, ondansetron  (ZOFRAN ) IV, mouth rinse, oxyCODONE -acetaminophen , polyethylene glycol  Consultants: None  Procedures: None  Antibiotics: None  Assessment/Plan: Principal Problem:   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  Continue IVF 0.45% Saline @KVO , continue weight based Dilaudid  PCA, IV Toradol  15 mg Q 6 H for a total of 5 days, continue oral home pain medications as ordered. Monitor vitals very closely, Re-evaluate pain scale regularly, 2 L of Oxygen by [place]. Patient encouraged to ambulate on the hallway today.  Anemia of Chronic Disease: Hemoglobin stable at patient's baseline.  Will continue to monitor daily CBC. Chronic pain Syndrome: Continue oral pain medication. Asthma: Patient is currently stable with no exacerbation.  Continue medication as prescribed.   Code Status: Full Code Family Communication: N/A Disposition Plan: Not yet ready for discharge  Maurice Ferguson Cover NP  If 7PM-7AM, please contact night-coverage.  07/17/2024, 3:37 PM  LOS: 0 days

## 2024-07-17 NOTE — H&P (Signed)
 History and Physical    Maurice Ferguson FMW:[id number] DOB: 08/17/2001 DOA: 07/16/2024  PCP: Patient, No Pcp Per   Patient coming from: Home   Chief Complaint:  Chief Complaint  Patient presents with   Sickle Cell Pain Crisis    HPI:  Maurice Ferguson is a 23 y.o. 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 Reports pain ongoing for past few days.  Normally he says that his pain crises involve his legs.  This was different because it was involving but his central chest / sternum and his lower back. He had a recent prescription for Percocet from the emergency department although pain was not controlled so sought care.  He denies any respiratory symptoms.  Has been smoking Black and milds and weed.    Review of Systems:  ROS complete and negative except as marked above   No Known Allergies  Prior to Admission medications   Medication Sig Start Date End Date Taking? Authorizing Provider  oxyCODONE -acetaminophen  (PERCOCET) 5-325 MG tablet Take 1 tablet by mouth every 4 (four) hours as needed for severe pain (pain score 7-10). 07/16/24 07/16/25 Yes Flint Sonny POUR, PA-C  traZODone  (DESYREL ) 50 MG tablet TAKE 1 TABLET(50 MG) BY MOUTH AT BEDTIME 05/01/24  Yes Harl Zane BRAVO, NP    Past Medical History:  Diagnosis Date   Acute chest pain    ADHD (attention deficit hyperactivity disorder)    ADHD   Allergy    seasonal allergies   Asthma    Asthma    Impacted teeth    Pneumonia    Sickle cell anemia (HCC)    Vision abnormalities    wears glasses    Past Surgical History:  Procedure Laterality Date   MULTIPLE EXTRACTIONS WITH ALVEOLOPLASTY Bilateral 04/21/2017   Procedure: MULTIPLE EXTRACTIONS;  Surgeon: Glendia Primrose, DDS;  Location: [facility] OR;  Service: Oral Surgery;  Laterality: Bilateral;   NO PAST SURGERIES       reports that he has never smoked. He has never  used smokeless tobacco. He reports that he does not currently use alcohol. He reports current drug use. Drug: Marijuana.  Family History  Problem Relation Age of Onset   Hypertension Maternal Grandmother    Sickle cell trait Mother    Sickle cell anemia Father      Physical Exam: Vitals:   07/17/24 0330 07/17/24 0400 07/17/24 0407 07/17/24 0500  BP: 128/85 (!) 153/100  119/81  Pulse: 67 83  63  Resp: 18 13 13 15   Temp: 98.6 F (37 C) 98.3 F (36.8 C)    TempSrc:  Oral    SpO2: 99% 99% 100% 100%  Weight:  61.3 kg    Height:  5' 10 (1.778 m)      Gen: Awake, alert, NAD   CV: Regular, normal S1, S2, no murmurs  Resp: Normal WOB, CTAB  Abd: Flat, normoactive, nontender MSK: + Chest wall tenderness.  No midline L-spine tenderness, there is significant right sided para lumbar spasm.  LE symmetric, no edema  Skin: No rashes or lesions to exposed skin  Neuro: Alert and interactive  Psych: euthymic, appropriate    Data review:   Labs reviewed, notable for:   K3.3 T. bili 1.3 HS trop neg  Reticulocyte percent 1.6 Hemoglobin 12, microcytic with intact RBC count  Micro:  Results for orders placed or performed during the hospital encounter  of 01/20/24  Resp panel by RT-PCR (RSV, Flu A&B, Covid) Anterior Nasal Swab     Status: Abnormal   Collection Time: 01/20/24  9:44 PM   Specimen: Anterior Nasal Swab  Result Value Ref Range Status   SARS Coronavirus 2 by RT PCR NEGATIVE NEGATIVE Final    Comment: (NOTE) SARS-CoV-2 target nucleic acids are NOT DETECTED.  The SARS-CoV-2 RNA is generally detectable in upper respiratory specimens during the acute phase of infection. The lowest concentration of SARS-CoV-2 viral copies this assay can detect is 138 copies/mL. A negative result does not preclude SARS-Cov-2 infection and should not be used as the sole basis for treatment or other patient management decisions. A negative result may occur with  improper specimen  collection/handling, submission of specimen other than nasopharyngeal swab, presence of viral mutation(s) within the areas targeted by this assay, and inadequate number of viral copies(<138 copies/mL). A negative result must be combined with clinical observations, patient history, and epidemiological information. The expected result is Negative.  Fact Sheet for Patients:  BloggerCourse.com  Fact Sheet for Healthcare Providers:  SeriousBroker.it  This test is no t yet approved or cleared by the [place]  FDA and  has been authorized for detection and/or diagnosis of SARS-CoV-2 by FDA under an Emergency Use Authorization (EUA). This EUA will remain  in effect (meaning this test can be used) for the duration of the COVID-19 declaration under Section 564(b)(1) of the Act, 21 U.S.C.section 360bbb-3(b)(1), unless the authorization is terminated  or revoked sooner.       Influenza A by PCR POSITIVE (A) NEGATIVE Final   Influenza B by PCR NEGATIVE NEGATIVE Final    Comment: (NOTE) The Xpert Xpress SARS-CoV-2/FLU/RSV plus assay is intended as an aid in the diagnosis of influenza from Nasopharyngeal swab specimens and should not be used as a sole basis for treatment. Nasal washings and aspirates are unacceptable for Xpert Xpress SARS-CoV-2/FLU/RSV testing.  Fact Sheet for Patients: BloggerCourse.com  Fact Sheet for Healthcare Providers: SeriousBroker.it  This test is not yet approved or cleared by the [place]  FDA and has been authorized for detection and/or diagnosis of SARS-CoV-2 by FDA under an Emergency Use Authorization (EUA). This EUA will remain in effect (meaning this test can be used) for the duration of the COVID-19 declaration under Section 564(b)(1) of the Act, 21 U.S.C. section 360bbb-3(b)(1), unless the authorization is terminated or revoked.     Resp Syncytial  Virus by PCR NEGATIVE NEGATIVE Final    Comment: (NOTE) Fact Sheet for Patients: BloggerCourse.com  Fact Sheet for Healthcare Providers: SeriousBroker.it  This test is not yet approved or cleared by the [place]  FDA and has been authorized for detection and/or diagnosis of SARS-CoV-2 by FDA under an Emergency Use Authorization (EUA). This EUA will remain in effect (meaning this test can be used) for the duration of the COVID-19 declaration under Section 564(b)(1) of the Act, 21 U.S.C. section 360bbb-3(b)(1), unless the authorization is terminated or revoked.  Performed at [facility], [address] [place], [place] [address]     Imaging reviewed:  DG Chest 2 View Result Date: 07/16/2024 CLINICAL DATA:  Shortness of breath EXAM: CHEST - 2 VIEW COMPARISON:  07/15/2024 FINDINGS: The lungs are clear without focal pneumonia, edema, pneumothorax or pleural effusion. The cardiopericardial silhouette is within normal limits for size. No acute bony abnormality. Telemetry leads overlie the chest. IMPRESSION: No active cardiopulmonary disease. Electronically Signed   By: Camellia Candle M.D.   On: 07/16/2024  07:05   DG Chest Portable 1 View Result Date: 07/15/2024 CLINICAL DATA:  Sickle cell crisis with mid chest pain that began yesterday. EXAM: PORTABLE CHEST 1 VIEW COMPARISON:  03/26/2024 FINDINGS: The heart size and mediastinal contours are within normal limits. Both lungs are clear. The visualized skeletal structures are unremarkable. IMPRESSION: No active disease. Electronically Signed   By: Elspeth Bathe M.D.   On: 07/15/2024 10:00    EKG:  Personally reviewed, sinus rhythm, U wave, T wave inversion anteriorly  ED Course:  Treated with morphine  IR 15 mg, 6 mg IV, 8 mg IV without control of symptoms.   Assessment/Plan:  23 y.o. 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  Admitted with vaso-occlusive crisis  Sickle cell-beta thalassemia+ Vasoocclusive crisis, suspect smoking as trigger  Hb 12 near baseline, microcytic + intact RBC count. Reticulocyte 1.6% not increased. T Bili mild elevation at 1.3. Smear this AM + sickle cells. P/w chest and low back pain. EKG with nonspecific changes, trop neg.  -admitted to [facility], Sickle cell team to assume care in a.m. - Placed on Dilaudid  PCA 0.1 mg q 10 min with 0.6 mg / 1hr lockout; this is starting about 1/2 of potency of his previous admission (required up to 1.25 mg / hr lockout). Can uptitrate q 4-6 hr as needed if pain remains uncontrolled.  -Adjuncts Tylenol  as needed mild, heat, lidocaine , Toradol  30 mg every 6 hour x 48-hour  -Restarted on folate -Stop IV fluid, continue oral hydration -Incentive spirometry    Body mass index is 19.39 kg/m.    DVT prophylaxis:  Lovenox  Code Status:  Full Code Diet:  Diet Orders (From admission, onward)     Start     Ordered   07/17/24 0020  Diet regular Room service appropriate? Yes; Fluid consistency: Thin  Diet effective now       Question Answer Comment  Room service appropriate? Yes   Fluid consistency: Thin      07/17/24 0022           Family Communication:  None   Consults: None  Admission status:   Inpatient, Step Down Unit  Severity of Illness: The appropriate patient status for this patient is INPATIENT. Inpatient status is judged to be reasonable and necessary in order to provide the required intensity of service to ensure the patient's safety. The patient's presenting symptoms, physical exam findings, and initial radiographic and laboratory data in the context of their chronic comorbidities is felt to place them at high risk for further clinical deterioration. Furthermore, it is not anticipated that the patient will be medically stable for  discharge from the hospital within 2 midnights of admission.   * I certify that at the point of admission it is my clinical judgment that the patient will require inpatient hospital care spanning beyond 2 midnights from the point of admission due to high intensity of service, high risk for further deterioration and high frequency of surveillance required.*   Dorn Dawson, MD [facility]  How to contact the [facility] Attending or Consulting provider 7A - 7P or covering provider during after hours 7P -7A, for this patient.  Check the care team in [facility] and look for a) attending/consulting [facility] provider listed and b) the [facility] team listed Log into [email or web address] and use [place]'s universal password to access. If you do  not have the password, please contact the hospital operator. Locate the [facility] provider you are looking for under [facility] and page to a number that you can be directly reached. If you still have difficulty reaching the provider, please page the [facility] (Director on Call) for the Hospitalists listed on amion for assistance.  07/17/2024, 6:28 AM

## 2024-07-17 NOTE — Plan of Care (Signed)

## 2024-07-18 LAB — CBC
HCT: 37.8 % — ABNORMAL LOW (ref 39.0–52.0)
Hemoglobin: 12.3 g/dL — ABNORMAL LOW (ref 13.0–17.0)
MCH: 22.7 pg — ABNORMAL LOW (ref 26.0–34.0)
MCHC: 32.5 g/dL (ref 30.0–36.0)
MCV: 69.6 fL — ABNORMAL LOW (ref 80.0–100.0)
Platelets: 133 K/uL — ABNORMAL LOW (ref 150–400)
RBC: 5.43 MIL/uL (ref 4.22–5.81)
RDW: 15.1 % (ref 11.5–15.5)
WBC: 4.7 K/uL (ref 4.0–10.5)
nRBC: 0 % (ref 0.0–0.2)

## 2024-07-18 NOTE — Progress Notes (Signed)
 Patient ID: Maurice Ferguson, male   DOB: 2001/07/29, 23 y.o.   MRN: [id number] Subjective: Maurice Ferguson is a 23 y.o. 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  Patient reports pain in bilateral lower legs.  He had a recent prescription for from the emergency department for Percocet which did not help his pain.  Patient is  reporting improved pain of 6/10 today.  He has no new concerns.  He denies fever, vomiting, nausea, diarrhea, fever, shortness of breath.  No urinary symptoms.  Objective:  Vital signs in last 24 hours:  Vitals:   07/18/24 0803 07/18/24 0936 07/18/24 1008 07/18/24 1306  BP:  (!) 122/92  113/87  Pulse:  67  64  Resp: 20 18 16 14   Temp:  98.8 F (37.1 C)  (!) 97.5 F (36.4 C)  TempSrc:  Oral  Oral  SpO2:  100%  100%  Weight:      Height:        Intake/Output from previous day:   Intake/Output Summary (Last 24 hours) at 07/18/2024 1513 Last data filed at 07/18/2024 1200 Gross per 24 hour  Intake 1286.5 ml  Output 700 ml  Net 586.5 ml    Physical Exam: General: Alert, awake, oriented x3, in no acute distress.  HEENT: [place]/AT PEERL, EOMI Neck: Trachea midline,  no masses, no thyromegal,y no JVD, no carotid bruit OROPHARYNX:  Moist, No exudate/ erythema/lesions.  Heart: Regular rate and rhythm, without murmurs, rubs, gallops, PMI non-displaced, no heaves or thrills on palpation.  Lungs: Clear to auscultation, no wheezing or rhonchi noted. No increased vocal fremitus resonant to percussion  Abdomen: Soft, nontender, nondistended, positive bowel sounds, no masses no hepatosplenomegaly noted..  Neuro: No focal neurological deficits noted cranial nerves II through XII grossly intact. DTRs 2+ bilaterally upper and lower extremities. Strength 5 out of 5 in bilateral upper and lower extremities. Musculoskeletal: Bilateral lower extremity tenderness.   Psychiatric:  Patient alert and oriented x3, good insight and cognition, good recent to remote recall. Lymph node survey: No cervical axillary or inguinal lymphadenopathy noted.  Lab Results:  Basic Metabolic Panel:    Component Value Date/Time   NA 138 07/17/2024 0452   K 3.6 07/17/2024 0452   CL 103 07/17/2024 0452   CO2 26 07/17/2024 0452   BUN 8 07/17/2024 0452   CREATININE 0.83 07/17/2024 0452   GLUCOSE 98 07/17/2024 0452   CALCIUM 8.7 (L) 07/17/2024 0452   CBC:    Component Value Date/Time   WBC 4.7 07/18/2024 0902   HGB 12.3 (L) 07/18/2024 0902   HCT 37.8 (L) 07/18/2024 0902   PLT 133 (L) 07/18/2024 0902   MCV 69.6 (L) 07/18/2024 0902   NEUTROABS 3.2 07/17/2024 0021   LYMPHSABS 1.7 07/17/2024 0021   MONOABS 0.5 07/17/2024 0021   EOSABS 0.6 (H) 07/17/2024 0021   BASOSABS 0.0 07/17/2024 0021    Recent Results (from the past 240 hours)  MRSA Next Gen by PCR, Nasal     Status: None   Collection Time: 07/17/24 12:20 PM   Specimen: Nasal Mucosa; Nasal Swab  Result Value Ref Range Status   MRSA by PCR Next Gen NOT DETECTED NOT DETECTED Final    Comment: (NOTE) The GeneXpert MRSA Assay (FDA approved for NASAL specimens only), is one component of a comprehensive MRSA colonization surveillance program. It is not intended to diagnose MRSA infection nor to guide or  monitor treatment for MRSA infections. Test performance is not FDA approved in patients less than 32 years old. Performed at [facility], [address], [place], [place] [address]     Studies/Results: No results found.   Medications: Scheduled Meds:  Chlorhexidine  Gluconate Cloth  6 each Topical Daily   enoxaparin  (LOVENOX ) injection  40 mg Subcutaneous QHS   folic acid   1 mg Oral Daily   HYDROmorphone    Intravenous Q4H   ketorolac   15 mg Intravenous Q6H   sodium chloride  flush  3 mL Intravenous Q12H   Continuous Infusions:   PRN Meds:.acetaminophen , albuterol , diclofenac  Sodium,  diphenhydrAMINE , lidocaine , melatonin, naloxone  **AND** sodium chloride  flush, ondansetron  (ZOFRAN ) IV, mouth rinse, oxyCODONE -acetaminophen , polyethylene glycol  Consultants: None  Procedures: None  Antibiotics: None  Assessment/Plan: Principal Problem:   Sickle cell-beta thalassemia disease with vaso-occlusive pain (HCC) Active Problems:   Asthma   Hb Sickle Cell Disease with Pain crisis: Pain is improving, continue IVF 0.45% Saline @KVO , continue weight based Dilaudid  PCA, IV Toradol  15 mg Q 6 H for a total of 5 days, continue oral home pain medications as ordered. Monitor vitals very closely, Re-evaluate pain scale regularly, 2 L of Oxygen by [place]. Patient encouraged to ambulate on the hallway today.  Anemia of Chronic Disease: Hemoglobin stable at patient's baseline. Will continue to monitor daily CBC. Chronic pain Syndrome: Continue oral pain medication. Asthma: Patient is currently stable with no exacerbation.  Continue medication as prescribed.   Code Status: Full Code Family Communication: N/A Disposition Plan: Discharge planning will start in the a.m.  Homer CHRISTELLA Cover NP  If 7PM-7AM, please contact night-coverage.  07/18/2024, 3:13 PM  LOS: 1 day

## 2024-07-18 NOTE — Plan of Care (Signed)
  Problem: Education: Goal: Knowledge of vaso-occlusive preventative measures will improve Outcome: Progressing Goal: Awareness of infection prevention will improve Outcome: Progressing Goal: Awareness of signs and symptoms of anemia will improve Outcome: Progressing   Problem: Self-Care: Goal: Ability to incorporate actions that prevent/reduce pain crisis will improve Outcome: Progressing

## 2024-07-19 LAB — CBC
HCT: 35.7 % — ABNORMAL LOW (ref 39.0–52.0)
Hemoglobin: 11.8 g/dL — ABNORMAL LOW (ref 13.0–17.0)
MCH: 23 pg — ABNORMAL LOW (ref 26.0–34.0)
MCHC: 33.1 g/dL (ref 30.0–36.0)
MCV: 69.7 fL — ABNORMAL LOW (ref 80.0–100.0)
Platelets: 137 K/uL — ABNORMAL LOW (ref 150–400)
RBC: 5.12 MIL/uL (ref 4.22–5.81)
RDW: 14.8 % (ref 11.5–15.5)
WBC: 4 K/uL (ref 4.0–10.5)
nRBC: 0 % (ref 0.0–0.2)

## 2024-07-19 NOTE — Plan of Care (Signed)
  Problem: Respiratory: Goal: Pulmonary complications will be avoided or minimized Outcome: Progressing   Problem: Sensory: Goal: Pain level will decrease with appropriate interventions Outcome: Progressing   Problem: Health Behavior: Goal: Postive changes in compliance with treatment and prescription regimens will improve Outcome: Progressing   Problem: Clinical Measurements: Goal: Will remain free from infection Outcome: Progressing   Problem: Nutrition: Goal: Adequate nutrition will be maintained Outcome: Progressing

## 2024-07-19 NOTE — Progress Notes (Signed)
 Patient ID: Maurice Ferguson, male   DOB: 2001-09-24, 23 y.o.   MRN: [id number] Subjective: Maurice Ferguson is a 23 y.o. 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  [name]  He denies fever, vomiting, nausea, diarrhea, fever, shortness of breath.  No urinary symptoms.  Objective:  Vital signs in last 24 hours:  Vitals:   07/19/24 0619 07/19/24 0714 07/19/24 0943 07/19/24 1110  BP: 123/88  109/71   Pulse: (!) 59  65   Resp: 18 12 18 13   Temp: 98.4 F (36.9 C)  97.9 F (36.6 C)   TempSrc:   Oral   SpO2: 100%  98%   Weight:      Height:        Intake/Output from previous day:   Intake/Output Summary (Last 24 hours) at 07/19/2024 1248 Last data filed at 07/19/2024 1110 Gross per 24 hour  Intake 544.5 ml  Output 1075 ml  Net -530.5 ml    Physical Exam: General: Alert, awake, oriented x3, in no acute distress.  HEENT: [place]/AT PEERL, EOMI Neck: Trachea midline,  no masses, no thyromegal,y no JVD, no carotid bruit OROPHARYNX:  Moist, No exudate/ erythema/lesions.  Heart: Regular rate and rhythm, without murmurs, rubs, gallops, PMI non-displaced, no heaves or thrills on palpation.  Lungs: Clear to auscultation, no wheezing or rhonchi noted. No increased vocal fremitus resonant to percussion  Abdomen: Soft, nontender, nondistended, positive bowel sounds, no masses no hepatosplenomegaly noted..  Neuro: No focal neurological deficits noted cranial nerves II through XII grossly intact. DTRs 2+ bilaterally upper and lower extremities. Strength 5 out of 5 in bilateral upper and lower extremities. Musculoskeletal: Bilateral lower extremity tenderness.   Psychiatric: Patient alert  and oriented x3, good insight and cognition, good recent to remote recall. Lymph node survey: No cervical axillary or inguinal lymphadenopathy noted.  Lab Results:  Basic Metabolic Panel:    Component Value Date/Time   NA 138 07/17/2024 0452   K 3.6 07/17/2024 0452   CL 103 07/17/2024 0452   CO2 26 07/17/2024 0452   BUN 8 07/17/2024 0452   CREATININE 0.83 07/17/2024 0452   GLUCOSE 98 07/17/2024 0452   CALCIUM 8.7 (L) 07/17/2024 0452   CBC:    Component Value Date/Time   WBC 4.0 07/19/2024 0521   HGB 11.8 (L) 07/19/2024 0521   HCT 35.7 (L) 07/19/2024 0521   PLT 137 (L) 07/19/2024 0521   MCV 69.7 (L) 07/19/2024 0521   NEUTROABS 3.2 07/17/2024 0021   LYMPHSABS 1.7 07/17/2024 0021   MONOABS 0.5 07/17/2024 0021   EOSABS 0.6 (H) 07/17/2024 0021   BASOSABS 0.0 07/17/2024 0021    Recent Results (from the past 240 hours)  MRSA Next Gen by PCR, Nasal     Status: None   Collection Time: 07/17/24 12:20 PM   Specimen: Nasal Mucosa; Nasal Swab  Result Value Ref Range Status   MRSA by PCR Next Gen NOT DETECTED NOT DETECTED Final    Comment: (NOTE) The GeneXpert MRSA Assay (FDA approved for NASAL specimens only), is one component of a comprehensive MRSA colonization surveillance program. It is not intended to diagnose MRSA infection nor to guide or monitor treatment  for MRSA infections. Test performance is not FDA approved in patients less than 52 years old. Performed at [facility], [address], [place], [place] [address]     Studies/Results: No results found.   Medications: Scheduled Meds:  Chlorhexidine  Gluconate Cloth  6 each Topical Daily   enoxaparin  (LOVENOX ) injection  40 mg Subcutaneous QHS   folic acid   1 mg Oral Daily   HYDROmorphone    Intravenous Q4H   ketorolac   15 mg Intravenous Q6H   sodium chloride  flush  3 mL Intravenous Q12H   Continuous Infusions:   PRN Meds:.acetaminophen , albuterol , diclofenac  Sodium, diphenhydrAMINE ,  lidocaine , melatonin, naloxone  **AND** sodium chloride  flush, ondansetron  (ZOFRAN ) IV, mouth rinse, oxyCODONE -acetaminophen , polyethylene glycol  Consultants: None  Procedures: None  Antibiotics: None  Assessment/Plan: Principal Problem:   Sickle cell-beta thalassemia disease with vaso-occlusive pain (HCC) Active Problems:   Asthma   Hb Sickle Cell Disease with Pain crisis: Pain is improving, continue IVF 0.45% Saline @KVO , continue weight based Dilaudid  PCA, IV Toradol  15 mg Q 6 H for a total of 5 days, continue oral home pain medications as ordered. Monitor vitals very closely, Re-evaluate pain scale regularly, 2 L of Oxygen by [place]. Patient encouraged to ambulate on the hallway today.  Anemia of Chronic Disease: Hemoglobin stable at patient's baseline. Will continue to monitor daily CBC. Chronic pain Syndrome: Continue oral pain medication. Asthma: Patient is currently stable with no exacerbation.  Continue medication as prescribed.   Code Status: Full Code Family Communication: N/A Disposition Plan: Discharge planning will start in the a.m.  Homer CHRISTELLA Cover NP  If 7PM-7AM, please contact night-coverage.  07/19/2024, 12:48 PM  LOS: 2 days

## 2024-07-20 DIAGNOSIS — D57419 Sickle-cell thalassemia with crisis, unspecified: Secondary | ICD-10-CM | POA: Diagnosis not present

## 2024-07-20 LAB — CBC
HCT: 36.8 % — ABNORMAL LOW (ref 39.0–52.0)
Hemoglobin: 12.4 g/dL — ABNORMAL LOW (ref 13.0–17.0)
MCH: 23.4 pg — ABNORMAL LOW (ref 26.0–34.0)
MCHC: 33.7 g/dL (ref 30.0–36.0)
MCV: 69.4 fL — ABNORMAL LOW (ref 80.0–100.0)
Platelets: 149 K/uL — ABNORMAL LOW (ref 150–400)
RBC: 5.3 MIL/uL (ref 4.22–5.81)
RDW: 14.8 % (ref 11.5–15.5)
WBC: 4.3 K/uL (ref 4.0–10.5)
nRBC: 0 % (ref 0.0–0.2)

## 2024-07-20 MED ORDER — OXYCODONE-ACETAMINOPHEN 5-325 MG PO TABS: 1.0000 | ORAL_TABLET | ORAL | 0 refills | Status: DC | PRN

## 2024-07-20 NOTE — Plan of Care (Signed)
  Problem: Education: Goal: Knowledge of vaso-occlusive preventative measures will improve Outcome: Progressing Goal: Awareness of infection prevention will improve Outcome: Progressing Goal: Long-term complications will improve Outcome: Progressing   Problem: Bowel/Gastric: Goal: Gut motility will be maintained Outcome: Progressing   Problem: Tissue Perfusion: Goal: Complications related to inadequate tissue perfusion will be avoided or minimized Outcome: Progressing   Problem: Respiratory: Goal: Pulmonary complications will be avoided or minimized Outcome: Progressing   Problem: Clinical Measurements: Goal: Diagnostic test results will improve Outcome: Progressing Goal: Respiratory complications will improve Outcome: Progressing Goal: Cardiovascular complication will be avoided Outcome: Progressing   Problem: Pain Managment: Goal: General experience of comfort will improve and/or be controlled Outcome: Progressing   Problem: Safety: Goal: Ability to remain free from injury will improve Outcome: Progressing

## 2024-07-20 NOTE — Progress Notes (Incomplete)
 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 If 7PM-7AM, please contact night-coverage.  07/20/2024, 10:51 AM  LOS: 3 days   Brief narrative: ***  Consultants: ***  Procedures: ***  Antibiotics: ***  HPI/Subjective: ***  Objective: Vitals:   07/20/24 0603 07/20/24 0750 07/20/24 0755 07/20/24 0935  BP: 93/81   111/69  Pulse: 69   71  Resp: 18 18 18 15   Temp: 98.5 F (36.9 C)   98 F (36.7 C)  TempSrc: Oral   Oral  SpO2: 100% 100%  98%  Weight:      Height:       Weight change:   Intake/Output Summary (Last 24 hours) at 07/20/2024 1051 Last data filed at 07/20/2024 0700 Gross per 24 hour  Intake 184.5 ml  Output 1350 ml  Net -1165.5 ml    General: Alert, awake, oriented x3, in no acute distress.  HEENT: [place]/AT PEERL, EOMI Neck: Trachea midline,  no masses, no thyromegal,y no JVD, no carotid bruit OROPHARYNX:  Moist, No exudate/ erythema/lesions.  Heart: Regular rate and rhythm, without murmurs, rubs, gallops, PMI non-displaced, no heaves or thrills on palpation.  Lungs: Clear to auscultation, no wheezing or rhonchi noted. No increased vocal fremitus resonant to percussion  Abdomen: Soft, nontender, nondistended, positive bowel sounds, no masses no hepatosplenomegaly noted..  Neuro: No focal neurological deficits noted cranial nerves II through XII grossly intact. DTRs 2+ bilaterally upper and lower extremities. Strength 5 out of 5 in bilateral upper and lower extremities. Musculoskeletal: No warm swelling or erythema around joints, no spinal tenderness noted. Psychiatric: Patient alert and oriented x3, good insight and cognition, good recent to remote  recall. Lymph node survey: No cervical axillary or inguinal lymphadenopathy noted.   Data Reviewed: Basic Metabolic Panel: Recent Labs  Lab 07/15/24 0935 07/16/24 0632 07/17/24 0452  NA 141 138 138  K 3.2* 3.3* 3.6  CL 108 104 103  CO2 26 27 26   GLUCOSE 95 129* 98  BUN 9 9 8   CREATININE 1.11 0.72 0.83  CALCIUM 8.9 8.9 8.7*  MG  --   --  2.1  PHOS  --   --  3.4   Liver Function Tests: Recent Labs  Lab 07/15/24 0935 07/16/24 0632 07/17/24 0452  AST 21 17 28   ALT 18 18 35  ALKPHOS 59 64 77  BILITOT 1.3* 1.3* 2.2*  PROT 6.9 6.5 6.5  ALBUMIN 4.2 4.1 3.8   No results for input(s): LIPASE, AMYLASE in the last 168 hours. No results for input(s): AMMONIA in the last 168 hours. CBC: Recent Labs  Lab 07/15/24 0935 07/16/24 9367 07/17/24 0021 07/17/24 0452 07/18/24 0902 07/19/24 0521 07/20/24 0601  WBC 8.6 8.6 6.0 6.8 4.7 4.0 4.3  NEUTROABS 6.0 6.3 3.2  --   --   --   --   HGB 13.0 12.2* 10.2* 11.6* 12.3* 11.8* 12.4*  HCT 39.7 36.6* 29.8* 35.3* 37.8* 35.7* 36.8*  MCV 69.6* 69.4* 68.5* 68.4* 69.6* 69.7* 69.4*  PLT 144* 139* 113* 138* 133* 137* 149*   Cardiac Enzymes: No results for input(s): CKTOTAL, CKMB, CKMBINDEX, TROPONINI in the last 168 hours. BNP (last 3 results) No results for input(s): BNP in the last 8760 hours.  ProBNP (last  3 results) No results for input(s): PROBNP in the last 8760 hours.  CBG: No results for input(s): GLUCAP in the last 168 hours.  Recent Results (from the past 240 hours)  MRSA Next Gen by PCR, Nasal     Status: None   Collection Time: 07/17/24 12:20 PM   Specimen: Nasal Mucosa; Nasal Swab  Result Value Ref Range Status   MRSA by PCR Next Gen NOT DETECTED NOT DETECTED Final    Comment: (NOTE) The GeneXpert MRSA Assay (FDA approved for NASAL specimens only), is one component of a comprehensive MRSA colonization surveillance program. It is not intended to diagnose MRSA infection nor to guide or monitor  treatment for MRSA infections. Test performance is not FDA approved in patients less than 16 years old. Performed at [facility], [address], [place], [place] [address]      Studies: DG Chest 2 View Result Date: 07/16/2024 CLINICAL DATA:  Shortness of breath EXAM: CHEST - 2 VIEW COMPARISON:  07/15/2024 FINDINGS: The lungs are clear without focal pneumonia, edema, pneumothorax or pleural effusion. The cardiopericardial silhouette is within normal limits for size. No acute bony abnormality. Telemetry leads overlie the chest. IMPRESSION: No active cardiopulmonary disease. Electronically Signed   By: Camellia Candle M.D.   On: 07/16/2024 07:05   DG Chest Portable 1 View Result Date: 07/15/2024 CLINICAL DATA:  Sickle cell crisis with mid chest pain that began yesterday. EXAM: PORTABLE CHEST 1 VIEW COMPARISON:  03/26/2024 FINDINGS: The heart size and mediastinal contours are within normal limits. Both lungs are clear. The visualized skeletal structures are unremarkable. IMPRESSION: No active disease. Electronically Signed   By: Elspeth Bathe M.D.   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

## 2024-07-20 NOTE — Discharge Summary (Signed)
 Physician Discharge Summary   Patient: Maurice Ferguson MRN: [id number] DOB: 2001-11-11  Admit date:     07/16/2024  Discharge date: {dischdate:26783}  Discharge Physician: SIM KNOLL   PCP: Patient, No Pcp Per   Recommendations at discharge:  {Tip this will not be part of the note when signed- Example include specific recommendations for outpatient follow-up, pending tests to follow-up on. 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 [facility] Course: No notes on file  Assessment and Plan: No notes have been filed under this hospital service. Service: Hospitalist     {Tip this will not be part of the note when signed Body mass index is 19.39 kg/m. , ,  (Optional):26781}  {(NOTE) Pain control PDMP Statment (Optional):26782} Consultants: *** Procedures performed: ***  Disposition: {Plan; Disposition:26390} Diet recommendation:  Discharge Diet Orders (From admission, onward)     Start     Ordered   07/20/24 0000  Diet - low sodium heart healthy        07/20/24 1500           {Diet_Plan:26776} DISCHARGE MEDICATION: Allergies as of 07/20/2024   No Known Allergies      Medication List     TAKE these medications    oxyCODONE -acetaminophen  5-325 MG tablet Commonly known as: Percocet Take 1 tablet by mouth every 4 (four) hours as needed for up to 3 days for severe pain (pain score 7-10).   traZODone  50 MG tablet Commonly known as: DESYREL  TAKE 1 TABLET(50 MG) BY MOUTH AT BEDTIME        Follow-up Information     [facility] Of [facility] Follow up.   Specialty: Internal Medicine Why: Appt scheduled 07/26/24 at 12p, will follow up to place patient on the will call list for a sooner appt if available. Contact information: [address] [place]  [address] [phone] Additional information: [address] [place], [place] [address]               Discharge Exam: Fredricka Weights   07/17/24 0400  Weight: 61.3 kg   ***  Condition at discharge: {DC Condition:26389}  The results of significant diagnostics from this hospitalization (including imaging, microbiology, ancillary and laboratory) are listed below for reference.   Imaging Studies: DG Chest 2 View Result Date: 07/16/2024 CLINICAL DATA:  Shortness of breath EXAM: CHEST - 2 VIEW COMPARISON:  07/15/2024 FINDINGS: The lungs are clear without focal pneumonia, edema, pneumothorax or pleural effusion. The cardiopericardial silhouette is within normal limits for size. No acute bony abnormality. Telemetry leads overlie the chest. IMPRESSION: No active cardiopulmonary disease. Electronically Signed   By: Camellia Candle M.D.   On: 07/16/2024 07:05   DG Chest Portable 1 View Result Date: 07/15/2024 CLINICAL DATA:  Sickle cell crisis with mid chest pain that began yesterday. EXAM: PORTABLE CHEST 1 VIEW COMPARISON:  03/26/2024 FINDINGS: The heart size and mediastinal contours are within normal limits. Both lungs are clear. The visualized skeletal structures are unremarkable. IMPRESSION: No active disease. Electronically Signed   By: Elspeth Bathe M.D.   On: 07/15/2024 10:00    Microbiology: Results for orders placed or performed during the hospital encounter of 07/16/24  MRSA Next Gen by PCR, Nasal     Status: None  Collection Time: 07/17/24 12:20 PM   Specimen: Nasal Mucosa; Nasal Swab  Result Value Ref Range Status   MRSA by PCR Next Gen NOT DETECTED NOT DETECTED Final    Comment: (NOTE) The GeneXpert MRSA Assay (FDA approved for NASAL specimens only), is one component of a comprehensive MRSA colonization surveillance program. It is not intended to diagnose MRSA infection nor to guide or monitor treatment for MRSA infections. Test performance  is not FDA approved in patients less than 60 years old. Performed at [facility], [address] [place], [place] [address]     Labs: CBC: Recent Labs  Lab 07/15/24 0935 07/16/24 9367 07/17/24 0021 07/17/24 0452 07/18/24 0902 07/19/24 0521 07/20/24 0601  WBC 8.6 8.6 6.0 6.8 4.7 4.0 4.3  NEUTROABS 6.0 6.3 3.2  --   --   --   --   HGB 13.0 12.2* 10.2* 11.6* 12.3* 11.8* 12.4*  HCT 39.7 36.6* 29.8* 35.3* 37.8* 35.7* 36.8*  MCV 69.6* 69.4* 68.5* 68.4* 69.6* 69.7* 69.4*  PLT 144* 139* 113* 138* 133* 137* 149*   Basic Metabolic Panel: Recent Labs  Lab 07/15/24 0935 07/16/24 0632 07/17/24 0452  NA 141 138 138  K 3.2* 3.3* 3.6  CL 108 104 103  CO2 26 27 26   GLUCOSE 95 129* 98  BUN 9 9 8   CREATININE 1.11 0.72 0.83  CALCIUM 8.9 8.9 8.7*  MG  --   --  2.1  PHOS  --   --  3.4   Liver Function Tests: Recent Labs  Lab 07/15/24 0935 07/16/24 0632 07/17/24 0452  AST 21 17 28   ALT 18 18 35  ALKPHOS 59 64 77  BILITOT 1.3* 1.3* 2.2*  PROT 6.9 6.5 6.5  ALBUMIN 4.2 4.1 3.8   CBG: No results for input(s): GLUCAP in the last 168 hours.  Discharge time spent: {LESS THAN/GREATER UYJW:73611} 30 minutes.  SignedBETHA SIM KNOLL, MD [facility] 07/20/2024

## 2024-07-21 NOTE — Hospital Course (Signed)
 Patient was admitted with sickle cell pain crisis.  Also has history of asthma.  Was treated with Dilaudid  PCA, Toradol , IV fluid.  Patient's pain responded to treatment.  He was also given oral medications.  Ultimately patient did better on the time of discharge his pain was down to 2 out of 10.  Patient discharged home to follow-up with PCP.

## 2024-07-26 ENCOUNTER — Ambulatory Visit (INDEPENDENT_AMBULATORY_CARE_PROVIDER_SITE_OTHER): Payer: MEDICAID | Admitting: Nurse Practitioner

## 2024-07-26 ENCOUNTER — Encounter: Payer: Self-pay | Admitting: Nurse Practitioner

## 2024-07-26 VITALS — BP 119/76 | HR 94 | Temp 98.6°F | Wt 135.0 lb

## 2024-07-26 DIAGNOSIS — D5744 Sickle-cell thalassemia beta plus without crisis: Secondary | ICD-10-CM

## 2024-07-26 MED ORDER — IBUPROFEN 600 MG PO TABS: 600.0000 mg | ORAL_TABLET | Freq: Three times a day (TID) | ORAL | 0 refills | Status: DC | PRN

## 2024-07-26 MED ORDER — OXYCODONE-ACETAMINOPHEN 5-325 MG PO TABS: 1.0000 | ORAL_TABLET | Freq: Three times a day (TID) | ORAL | 0 refills | Status: DC | PRN

## 2024-07-26 MED ORDER — FOLIC ACID 800 MCG PO TABS: 400.0000 ug | ORAL_TABLET | Freq: Every day | ORAL | 2 refills | Status: AC

## 2024-07-26 NOTE — Patient Instructions (Signed)
 Living With Sickle Cell Disease Living with a long-term condition, such as sickle cell disease, can be a challenge. It can affect both your physical and mental health. You may not have total control over your condition. But proper care and treatment can help manage the effects of the disease so you can feel good and lead an active life. You can take steps to manage your condition and stay as healthy as possible. How does sickle cell disease affect me? Sickle cell disease can cause challenges that affect your quality of life. You may get sick more often as a result of organ damage and infections. Sometimes you may need to stay in the hospital. Learn how to recognize that you are not feeling well and that you may be getting sick. What actions can I take to manage my condition?  The goals of treatment are to control your symptoms and prevent and treat problems. Work with your health care provider to create a treatment plan that works for you. Taking an active role in managing your condition can help you feel more in control of your situation. Ask about possible side effects of medicines that your health care provider recommends. Discuss how you feel about having those side effects. Keeping a healthy lifestyle can help you manage your condition. This includes eating a healthy diet, getting enough sleep, and getting regular exercise. Sickle cell disease may affect your ability to take care of your basic needs. Tell your health care provider if you have concerns about any of these needs: Access to food. Housing. Safe drinking water and other utilities. Safety in your home and community. Work or school. Transportation. Paying for health care. Your health care provider may be able to connect you with community resources that can help you. How to manage stress  Living with sickle cell disease can be stressful. This disease can have a big impact on your mental health. Talk with your health care provider  about ways to reduce your stress or if you have concerns about your mental health.  To cope with stress, try: Keeping a stress diary. This can help you learn what causes your stress to start (figure out your triggers) and how to control your response to those triggers. Spending time doing things that you enjoy, such as: Hobbies. Being outdoors. Spending time with friends and people who make you laugh. Doing yoga, muscle relaxation, deep breathing, or mindfulness practices. Expressing yourself through journal writing, art, crafting, poetry, or playing music. Staying positive about your health. Try to accept that you cannot control your condition perfectly. Follow these instructions at home: Medicines Take over-the-counter and prescription medicines only as told by your health care provider. If you were prescribed antibiotics, take them as told by your health care provider. Do not stop taking them even if you start to feel better. If you develop a fever, do not take medicines to reduce the fever right away. This could cover up another problem. Contact your health care provider. Eating and drinking Drink enough fluid to keep your urine pale yellow. Drink more in hot weather and during exercise. Limit or avoid drinking alcohol. Eat a balanced and nutritious diet. Eat plenty of fruits, vegetables, whole grains, and lean protein. Take vitamins and supplements as told by your health care provider. Traveling When traveling, keep these with you: Your medical information. The names of your health care providers. Your medicines. If you have to travel by air, ask about precautions you should take. Managing pain Work with  your health care provider to create a pain management plan that works for you. The plan may include: Ways to reduce or manage your pain at home, such as: Using a heating pad. Taking a warm bath. Using healthy ways to distract you from the pain, such as hobbies or  reading. Practicing ways to relax, such as doing yoga or listening to music. Getting massages. Doing exercises or stretches as told by a physical therapist. Tracking how pain affects your daily life functions. When to seek help. Who to contact and what to do in case of a pain emergency. General instructions Do not use any products that contain nicotine or tobacco. These products include cigarettes, chewing tobacco, and vaping devices, such as e-cigarettes. These lower blood oxygen levels. If you need help quitting, ask your health care provider. Consider wearing a medical alert bracelet. Use an app or journal to track your symptoms, assess your level of pain and fatigue, and keep track of your medicines. Avoid the following: High altitudes. Very high or low temperatures and big changes in temperature. Activities that will lower your oxygen levels, such as mountain climbing or doing exercise that takes a lot of effort. Stay up to date on: Your treatment plan. Learn as much as you can about your condition. Health screenings. This will help prevent problems or catch them early on. Vaccines. This will help prevent infection. Wash your hands often with soap and water to help prevent infections. Wash them for at least 20 seconds each time. Keep all follow-up visits. Regular follow-up with your health care provider can help you better manage your condition. Where to find support You can find help and support through: Talking with a therapist or taking part in support groups. Sickle Cell Disease Foundation of [place]: www.sicklecelldisease.org Where to find more information Centers for Disease Control and Prevention: FootballExhibition.com.br American Society of Hematology: www.hematology.org Contact a health care provider if: Your symptoms get worse. You have new symptoms. You have a fever. Get help right away if: You have a painful erection of the penis that lasts a long time (priapism). You become  short of breath or are having trouble breathing. You have pain that cannot be controlled with medicine. You have any signs of a stroke. "BE FAST" is an easy way to remember the main warning signs: B - Balance. Dizziness, sudden trouble walking, or loss of balance. E - Eyes. Trouble seeing or a change in how you see. F - Face. Sudden weakness or loss of feeling of the face. The face or eyelid may droop on one side. A - Arms. Weakness or loss of feeling in an arm. This happens all of a sudden and most often on one side of the body. S - Speech. Sudden trouble speaking, slurred speech, or trouble understanding what people say. T - Time. Time to call emergency services. Write down what time symptoms started. You have other signs of a stroke, such as: A sudden, very bad headache with no known cause. Feeling like you may vomit (nausea). Vomiting. Seizure. These symptoms may be an emergency. Get help right away. Call 911. Do not wait to see if the symptoms will go away. Do not drive yourself to the hospital. Also, get help right away if: You have strong feelings of sadness or loss of hope, or you have thoughts about hurting yourself or others. Take one of these steps if you feel like you may hurt yourself or others, or have thoughts about taking your own life:  Go to your nearest emergency room. Call 911. Call the National Suicide Prevention Lifeline at (915)371-4213 or 988. This is open 24 hours a day. Text the Crisis Text Line at (909)230-5332. Summary Proper care and treatment can help manage the effects of sickle cell disease so you can feel good and lead an active life. The goals of treatment are to control your symptoms and prevent and treat problems. Taking an active role in managing your condition can help you feel more in control of your situation. Work with your health care provider to create a pain management plan that works for you. Get medical help right away as told by your health care  provider. This information is not intended to replace advice given to you by your health care provider. Make sure you discuss any questions you have with your health care provider. Document Revised: 03/21/2022 Document Reviewed: 03/21/2022 Elsevier Patient Education  2024 [employer].

## 2024-07-26 NOTE — Progress Notes (Signed)
 Subjective   Patient ID: Maurice Ferguson, male    DOB: 09-25-2001, 23 y.o.   MRN: [id number]  Chief Complaint  Patient presents with   Medical Management of Chronic Issues    Follow up    Referring provider: No ref. provider found  Maurice Ferguson is a 23 y.o. male with Past Medical History: No date: Acute chest pain No date: ADHD (attention deficit hyperactivity disorder)     Comment:  ADHD No date: Allergy     Comment:  seasonal allergies No date: Asthma No date: Asthma No date: Impacted teeth No date: Pneumonia No date: Sickle cell anemia (HCC) No date: Vision abnormalities     Comment:  wears glasses   HPI  Patient presents today to establish care.  He does have a history of sickle cell.  He has recently been in the emergency room multiple times.  He does not currently have a PCP.  He is taking Percocet as needed for sickle cell pain but has not been able to get this on a schedule which has resulted in multiple pain crisis. Denies f/c/s, n/v/d, hemoptysis, PND, leg swelling Denies chest pain or edema     No Known Allergies  Immunization History  Administered Date(s) Administered   Influenza Split 01/31/2012   Pneumococcal Polysaccharide-23 12/21/2014   Tdap 11/10/2022    Tobacco History: Social History   Tobacco Use  Smoking Status Never  Smokeless Tobacco Never   Counseling given: Not Answered   Outpatient Encounter Medications as of 07/26/2024  Medication Sig   folic acid  (FOLVITE ) 800 MCG tablet Take 0.5 tablets (400 mcg total) by mouth daily.   ibuprofen  (ADVIL ) 600 MG tablet Take 1 tablet (600 mg total) by mouth every 8 (eight) hours as needed.   oxyCODONE -acetaminophen  (PERCOCET) 5-325 MG tablet Take 1 tablet by mouth every 8 (eight) hours as needed for up to 3 days for severe pain (pain score 7-10).   traZODone  (DESYREL ) 50 MG tablet TAKE 1 TABLET(50 MG) BY MOUTH AT BEDTIME   [DISCONTINUED] oxyCODONE -acetaminophen  (PERCOCET) 5-325 MG tablet Take 1  tablet by mouth every 4 (four) hours as needed for up to 3 days for severe pain (pain score 7-10).   No facility-administered encounter medications on file as of 07/26/2024.    Review of Systems  Review of Systems  Constitutional: Negative.   HENT: Negative.    Cardiovascular: Negative.   Gastrointestinal: Negative.   Allergic/Immunologic: Negative.   Neurological: Negative.   Psychiatric/Behavioral: Negative.       Objective:   BP 119/76   Pulse 94   Temp 98.6 F (37 C) (Oral)   Wt 135 lb (61.2 kg)   SpO2 100%   BMI 19.37 kg/m   Wt Readings from Last 5 Encounters:  07/26/24 135 lb (61.2 kg)  07/17/24 135 lb 2.3 oz (61.3 kg)  07/15/24 127 lb 13.9 oz (58 kg)  06/07/24 125 lb 10.6 oz (57 kg)  01/20/24 125 lb 10.6 oz (57 kg)     Physical Exam Vitals and nursing note reviewed.  Constitutional:      General: He is not in acute distress.    Appearance: He is well-developed.  Cardiovascular:     Rate and Rhythm: Normal rate and regular rhythm.  Pulmonary:     Effort: Pulmonary effort is normal.     Breath sounds: Normal breath sounds.  Skin:    General: Skin is warm and dry.  Neurological:     Mental Status: He is  alert and oriented to person, place, and time.       Assessment & Plan:   Sickle cell disease, type S beta-plus thalassemia (HCC) -     ToxAssure Flex 15, Ur -     Sickle Cell Panel -     ToxAssure Flex 15, Ur -     oxyCODONE -Acetaminophen ; Take 1 tablet by mouth every 8 (eight) hours as needed for up to 3 days for severe pain (pain score 7-10).  Dispense: 45 tablet; Refill: 0 -     Ibuprofen ; Take 1 tablet (600 mg total) by mouth every 8 (eight) hours as needed.  Dispense: 30 tablet; Refill: 0 -     Folic Acid ; Take 0.5 tablets (400 mcg total) by mouth daily.  Dispense: 30 tablet; Refill: 2 -     Microalbumin / creatinine urine ratio     Return in about 3 months (around 10/26/2024) for Physical.   Bascom GORMAN Borer, NP 07/26/2024

## 2024-07-27 LAB — CMP14+CBC/D/PLT+FER+RETIC+V...
ALT: 77 IU/L — ABNORMAL HIGH (ref 0–44)
AST: 31 IU/L (ref 0–40)
Albumin: 4.8 g/dL (ref 4.3–5.2)
Alkaline Phosphatase: 86 IU/L (ref 44–121)
BUN/Creatinine Ratio: 8 — ABNORMAL LOW (ref 9–20)
BUN: 7 mg/dL (ref 6–20)
Basophils Absolute: 0.1 x10E3/uL (ref 0.0–0.2)
Basos: 1 %
Bilirubin Total: 0.7 mg/dL (ref 0.0–1.2)
CO2: 20 mmol/L (ref 20–29)
Calcium: 10.1 mg/dL (ref 8.7–10.2)
Chloride: 103 mmol/L (ref 96–106)
Creatinine, Ser: 0.88 mg/dL (ref 0.76–1.27)
EOS (ABSOLUTE): 0.2 x10E3/uL (ref 0.0–0.4)
Eos: 3 %
Ferritin: 316 ng/mL (ref 30–400)
Globulin, Total: 2.4 g/dL (ref 1.5–4.5)
Glucose: 92 mg/dL (ref 70–99)
Hematocrit: 42 % (ref 37.5–51.0)
Hemoglobin: 12.9 g/dL — ABNORMAL LOW (ref 13.0–17.7)
Immature Grans (Abs): 0 x10E3/uL (ref 0.0–0.1)
Immature Granulocytes: 0 %
Lymphocytes Absolute: 1.9 x10E3/uL (ref 0.7–3.1)
Lymphs: 28 %
MCH: 22.8 pg — ABNORMAL LOW (ref 26.6–33.0)
MCHC: 30.7 g/dL — ABNORMAL LOW (ref 31.5–35.7)
MCV: 74 fL — ABNORMAL LOW (ref 79–97)
Monocytes Absolute: 0.4 x10E3/uL (ref 0.1–0.9)
Monocytes: 6 %
Neutrophils Absolute: 4.2 x10E3/uL (ref 1.4–7.0)
Neutrophils: 62 %
Platelets: 317 x10E3/uL (ref 150–450)
Potassium: 4.8 mmol/L (ref 3.5–5.2)
RBC: 5.65 x10E6/uL (ref 4.14–5.80)
RDW: 16.5 % — ABNORMAL HIGH (ref 11.6–15.4)
Retic Ct Pct: 1.9 % (ref 0.6–2.6)
Sodium: 141 mmol/L (ref 134–144)
Total Protein: 7.2 g/dL (ref 6.0–8.5)
Vit D, 25-Hydroxy: 34.7 ng/mL (ref 30.0–100.0)
WBC: 6.9 x10E3/uL (ref 3.4–10.8)
eGFR: 125 mL/min/1.73 (ref >59)

## 2024-07-28 LAB — MICROALBUMIN / CREATININE URINE RATIO
Creatinine, Urine: 86.4 mg/dL
Microalb/Creat Ratio: <3 mg/g{creat} (ref 0–29)
Microalbumin, Urine: <3 ug/mL

## 2024-07-29 ENCOUNTER — Ambulatory Visit: Payer: Self-pay | Admitting: Nurse Practitioner

## 2024-07-29 ENCOUNTER — Other Ambulatory Visit: Payer: Self-pay

## 2024-07-29 ENCOUNTER — Telehealth: Payer: Self-pay

## 2024-07-29 NOTE — Telephone Encounter (Signed)
 Sent to provider

## 2024-07-29 NOTE — Telephone Encounter (Signed)
 Pharmacy Patient Advocate Encounter  Received notification from [facility] that Prior Authorization for OXYCODONE -ACETAMINOPHEN  has been APPROVED from 07/29/2024 to 01/29/2025   PA #/Case ID/Reference #: [id number]

## 2024-07-29 NOTE — Telephone Encounter (Signed)
 Pharmacy Patient Advocate Encounter   Received notification from CoverMyMeds that prior authorization for OXYCODONE -ACETAMINOPHEN  is required/requested.   Insurance verification completed.   The patient is insured through [facility] .   Per test claim: PA required; PA submitted to above mentioned insurance via CoverMyMeds Key/confirmation #/EOC [id number] Status is pending SUBMITTED AS EXPEDITED REQUEST

## 2024-07-30 LAB — TOXASSURE FLEX 15, UR
6-ACETYLMORPHINE IA: NEGATIVE ng/mL
7-aminoclonazepam: NOT DETECTED ng/mg{creat}
AMPHETAMINES IA: NEGATIVE ng/mL
Alpha-hydroxyalprazolam: NOT DETECTED ng/mg{creat}
Alpha-hydroxymidazolam: NOT DETECTED ng/mg{creat}
Alpha-hydroxytriazolam: NOT DETECTED ng/mg{creat}
Alprazolam: NOT DETECTED ng/mg{creat}
BARBITURATES IA: NEGATIVE ng/mL
Buprenorphine: NOT DETECTED ng/mg{creat}
COCAINE METABOLITE IA: NEGATIVE ng/mL
Clonazepam: NOT DETECTED ng/mg{creat}
Creatinine: 89 mg/dL (ref >=20)
Desalkylflurazepam: NOT DETECTED ng/mg{creat}
Desmethyldiazepam: NOT DETECTED ng/mg{creat}
Desmethylflunitrazepam: NOT DETECTED ng/mg{creat}
Diazepam: NOT DETECTED ng/mg{creat}
ETHYL ALCOHOL Enzymatic: NEGATIVE g/dL
Fentanyl: NOT DETECTED ng/mg{creat}
Flunitrazepam: NOT DETECTED ng/mg{creat}
Lorazepam: NOT DETECTED ng/mg{creat}
METHADONE IA: NEGATIVE ng/mL
METHADONE MTB IA: NEGATIVE ng/mL
Midazolam: NOT DETECTED ng/mg{creat}
Norbuprenorphine: NOT DETECTED ng/mg{creat}
Norfentanyl: NOT DETECTED ng/mg{creat}
OPIATE CLASS IA: NEGATIVE ng/mL
OXYCODONE CLASS IA: NEGATIVE ng/mL
Oxazepam: NOT DETECTED ng/mg{creat}
PHENCYCLIDINE IA: NEGATIVE ng/mL
TAPENTADOL, IA: NEGATIVE ng/mL
TRAMADOL IA: NEGATIVE ng/mL
Temazepam: NOT DETECTED ng/mg{creat}

## 2024-07-30 LAB — CANNABINOIDS, MS, UR RFX
Cannabinoids Confirmation: POSITIVE
Carboxy-THC: 257 ng/mg{creat}

## 2024-08-02 ENCOUNTER — Other Ambulatory Visit: Payer: Self-pay | Admitting: Nurse Practitioner

## 2024-08-05 ENCOUNTER — Other Ambulatory Visit: Payer: Self-pay | Admitting: Nurse Practitioner

## 2024-08-05 DIAGNOSIS — D5744 Sickle-cell thalassemia beta plus without crisis: Secondary | ICD-10-CM

## 2024-08-05 MED ORDER — OXYCODONE-ACETAMINOPHEN 5-325 MG PO TABS: 1.0000 | ORAL_TABLET | Freq: Three times a day (TID) | ORAL | 0 refills | Status: DC | PRN

## 2024-08-05 NOTE — Telephone Encounter (Signed)
 Copied from CRM #[id number]. Topic: Clinical - Medication Refill >> Aug 05, 2024  9:56 AM Myrick T wrote: Medication: oxyCODONE -acetaminophen  (PERCOCET) 5-325 MG tablet  Has the patient contacted their pharmacy? Yes  This is the patient's preferred pharmacy:  [facility] [address] [place] - [address] [place] [address] Phone: [phone] Fax: [phone]  Is this the correct pharmacy for this prescription? Yes  Has the prescription been filled recently? Yes  Is the patient out of the medication? Yes  Has the patient been seen for an appointment in the last year OR does the patient have an upcoming appointment? Yes  Can we respond through MyChart? Yes  Agent: Please be advised that Rx refills may take up to 3 business days. We ask that you follow-up with your pharmacy.

## 2024-08-15 ENCOUNTER — Telehealth: Payer: Self-pay

## 2024-08-15 NOTE — Telephone Encounter (Unsigned)
 Copied from CRM #[id number]. Topic: Clinical - Medication Question >> Aug 15, 2024  8:57 AM Vena H wrote: Reason for CRM: Pt is wanting to know if he can get a day supply of oxyCODONE -acetaminophen  (PERCOCET) 5-325 MG tablet due to his grandma throwing his away. Please reach over

## 2024-08-15 NOTE — Telephone Encounter (Signed)
 Sent to provider

## 2024-08-16 ENCOUNTER — Other Ambulatory Visit: Payer: Self-pay | Admitting: Nurse Practitioner

## 2024-08-16 DIAGNOSIS — D5744 Sickle-cell thalassemia beta plus without crisis: Secondary | ICD-10-CM

## 2024-08-16 MED ORDER — OXYCODONE-ACETAMINOPHEN 5-325 MG PO TABS: 1.0000 | ORAL_TABLET | Freq: Three times a day (TID) | ORAL | 0 refills | Status: DC | PRN

## 2024-08-30 ENCOUNTER — Ambulatory Visit: Payer: Self-pay

## 2024-08-30 ENCOUNTER — Telehealth: Payer: Self-pay | Admitting: Nurse Practitioner

## 2024-08-30 ENCOUNTER — Other Ambulatory Visit: Payer: Self-pay | Admitting: Nurse Practitioner

## 2024-08-30 NOTE — Telephone Encounter (Unsigned)
 Copied from CRM [id number]. Topic: Clinical - Medication Refill >> Aug 30, 2024  8:29 AM Tiffini S wrote: Medication: oxyCODONE -acetaminophen  (PERCOCET) 5-325 MG tablet, asking for a increase in the dosage due to the pain- please increase to 10mg  tablet   Has the patient contacted their pharmacy? No (Agent: If no, request that the patient contact the pharmacy for the refill. If patient does not wish to contact the pharmacy document the reason why and proceed with request.) (Agent: If yes, when and what did the pharmacy advise?)  This is the patient's preferred pharmacy:    [facility] [address] [place] - [address] [place] [address] Phone: [phone] Fax: [phone]   Is this the correct pharmacy for this prescription? Yes If no, delete pharmacy and type the correct one.   Has the prescription been filled recently? Yes  Is the patient out of the medication? Yes, took the last two tablets today   Has the patient been seen for an appointment in the last year OR does the patient have an upcoming appointment? Yes  Can we respond through MyChart? Yes  Agent: Please be advised that Rx refills may take up to 3 business days. We ask that you follow-up with your pharmacy.

## 2024-08-30 NOTE — Telephone Encounter (Signed)
 Please advise [facility]

## 2024-08-30 NOTE — Telephone Encounter (Signed)
 FYI Only or Action Required?: FYI only for provider.  Patient was last seen in primary care on 07/26/2024 by Oley Bascom RAMAN, NP.  Called Nurse Triage reporting Pain.  Symptoms began several days ago.  Interventions attempted: Prescription medications: percocet.  Symptoms are: unchanged.  Triage Disposition: Go to ED Now (or PCP Triage)  Patient/caregiver understands and will follow disposition?: Yes - pt will call [facility] [phone]                  Copied from CRM #[id number]. Topic: Clinical - Red Word Triage >> Aug 30, 2024  8:12 AM Montie POUR wrote: Red Word that prompted transfer to Nurse Triage:   He wants to up the dosage for oxyCODONE -acetaminophen  (PERCOCET) 5-325 MG tablet. He is having a lot of pain. Pain level at a 7. Reason for Disposition  [1] SEVERE sickle cell pain (e.g., pain episode, sickle cell attack) AND [2] has pain management plan AND [3] NOT better after taking pain medicine per plan  Answer Assessment - Initial Assessment Questions 1. ONSET: When did the pain begin?      ongoing  3. SEVERITY: How bad is the pain?  (e.g., Scale 1-10; mild, moderate, or severe)     7/10 4. PATTERN: Is the pain constant? (e.g., yes, no; constant, intermittent)      yes 5. CAUSE:  What do you think is causing the pain? Is this pain similar to the acute pain attacks you have had in the past? (e.g., similar location, quality, intensity)     Body is trying to go into SSC 6. PAIN MEDICINES:      Percocet - pt is out of meds  Protocols used: Sickle Cell Disease - Acute Pain Episode (Crisis)-A-AH

## 2024-09-03 ENCOUNTER — Other Ambulatory Visit: Payer: Self-pay

## 2024-09-03 DIAGNOSIS — D5744 Sickle-cell thalassemia beta plus without crisis: Secondary | ICD-10-CM

## 2024-09-04 ENCOUNTER — Other Ambulatory Visit: Payer: Self-pay | Admitting: Nurse Practitioner

## 2024-09-04 MED ORDER — OXYCODONE-ACETAMINOPHEN 10-325 MG PO TABS: 1.0000 | ORAL_TABLET | Freq: Three times a day (TID) | ORAL | 0 refills | Status: DC | PRN

## 2024-09-04 NOTE — Telephone Encounter (Signed)
 Please advise [facility]

## 2024-09-04 NOTE — Telephone Encounter (Signed)
Pt called to check on status of rx

## 2024-09-10 ENCOUNTER — Other Ambulatory Visit: Payer: Self-pay | Admitting: Nurse Practitioner

## 2024-09-10 NOTE — Telephone Encounter (Unsigned)
 Copied from CRM [id number]. Topic: Clinical - Medication Refill >> Sep 10, 2024 10:21 AM Winona R wrote: Medication: oxyCODONE -acetaminophen  (PERCOCET) 10-325 MG tablet  Has the patient contacted their pharmacy? No (Agent: If no, request that the patient contact the pharmacy for the refill. If patient does not wish to contact the pharmacy document the reason why and proceed with request.) (Agent: If yes, when and what did the pharmacy advise?)  This is the patient's preferred pharmacy:  [facility] #[id number] - [place], [place] - [address] [place] [address] Phone: [phone] Fax: [phone]  [facility] DR[facility] STORE [address] [place] - [address] [place] [address] Phone: [phone] Fax: [phone]  [place] [facility] [address] [place] [address] Phone: [phone] Fax: [phone]  [place] [facility] DR[facility] STORE #[id number] [place], [place] - [address] AT [facility] [address] & [address] [place] [address] Phone: [phone] Fax: [phone]  Is this the correct pharmacy for this prescription? Yes If no, delete pharmacy and type the correct one.   Has the prescription been filled recently? Yes  Is the patient out of the medication? Yes  Has the patient been seen for an appointment in the last year OR does the patient have an upcoming appointment? Yes  Can we respond through MyChart? Yes  Agent: Please be advised that Rx refills may take up to 3 business days. We ask that you follow-up with your pharmacy.

## 2024-09-11 ENCOUNTER — Telehealth: Payer: Self-pay

## 2024-09-11 NOTE — Telephone Encounter (Signed)
>>   Sep 11, 2024  9:17 AM Treva T wrote: Reason for CRM: Patient calling to check status of mediction refill, requested on 09/10/24.   Medication: oxyCODONE -acetaminophen  (PERCOCET) 10-325 MG tablet  Patient advised, that Rx refills may take up to 3 business days. And can follow up with pharmacy to check status of refill request.   Per chart review, medication refill is still pending approval from provider.   Patient informed of above, verbalized understanding.  Patient requesting  a return call when medication sent to pharmacy, [phone]  [facility] [address], [place] - [address] [place] [address] Phone: [phone] Fax: [phone]   Copied from CRM [id number]. Topic: Clinical - Medication Refill >> Sep 10, 2024 10:21 AM Winona R wrote: Medication: oxyCODONE -acetaminophen  (PERCOCET) 10-325 MG tablet  Has the patient contacted their pharmacy? No (Agent: If no, request that the patient contact the pharmacy for the refill. If patient does not wish to contact the pharmacy document the reason why and proceed with request.) (Agent: If yes, when and what did the pharmacy advise?)  This is the patient's preferred pharmacy:  [facility] DR[facility] STORE #[id number] - [place], [place] - [address]  [place] [address] Phone: [phone] Fax: [phone]  [facility] DRUG STORE [address], [place] - [address] [place] [address] Phone: [phone] Fax: [phone]  [place] [facility] [address] [place] [address] Phone: [phone] Fax: [phone]  [facility] DRUG STORE #[id number] [place], [place] - [address] AT [facility] OF [address] & [address] [place] [address] Phone: [phone] Fax: [phone]  Is this the correct pharmacy for this prescription? Yes If no, delete pharmacy  and type the correct one.   Has the prescription been filled recently? Yes  Is the patient out of the medication? Yes  Has the patient been seen for an appointment in the last year OR does the patient have an upcoming appointment? Yes  Can we respond through MyChart? Yes  Agent: Please be advised that Rx refills may take up to 3 business days. We ask that you follow-up with your pharmacy.

## 2024-09-13 ENCOUNTER — Other Ambulatory Visit: Payer: Self-pay

## 2024-09-13 ENCOUNTER — Telehealth: Payer: Self-pay

## 2024-09-13 NOTE — Telephone Encounter (Signed)
 Done [facility]

## 2024-09-13 NOTE — Telephone Encounter (Signed)
 Copied from CRM [id number]. Topic: Clinical - Medication Question >> Sep 13, 2024  8:08 AM Anairis L wrote: Reason for CRM: Patient is calling in to see why oxyCODONE -acetaminophen  (PERCOCET) 10-325 MG tablet has been denied. Please reach out via Mychart

## 2024-09-23 ENCOUNTER — Other Ambulatory Visit: Payer: Self-pay | Admitting: Nurse Practitioner

## 2024-09-23 NOTE — Telephone Encounter (Unsigned)
 Copied from CRM #[id number]. Topic: Clinical - Medication Refill >> Sep 23, 2024  8:43 AM Turkey B wrote: Medication:  oxyCODONE -acetaminophen  (PERCOCET) 10-325 MG tablet       Has the patient contacted their pharmacy? No   This is the patient's preferred pharmacy:  [facility] [address] [place] - [address] [place] [address] Phone: [phone] Fax: [phone]    Is this the correct pharmacy for this prescription? yes  Has the prescription been filled recently? no  Is the patient out of the medication? No has 2 left  Has the patient been seen for an appointment in the last year OR does the patient have an upcoming appointment? yes  Can we respond through MyChart? yes  Agent: Please be advised that Rx refills may take up to 3 business days. We ask that you follow-up with your pharmacy.

## 2024-09-24 NOTE — Telephone Encounter (Signed)
 LOV 07/26/24 Next OV 10/28/24 Last refill 09/04/24, #45, 0 refills  Please review, thanks!

## 2024-09-26 NOTE — Telephone Encounter (Signed)
 Please advise [facility]

## 2024-09-26 NOTE — Telephone Encounter (Signed)
 Patient calling for update on refill request. Advised still pending provider approval. Patient requesting refill as he has run out.

## 2024-09-27 ENCOUNTER — Other Ambulatory Visit: Payer: Self-pay | Admitting: Nurse Practitioner

## 2024-09-27 MED ORDER — OXYCODONE-ACETAMINOPHEN 10-325 MG PO TABS: 1.0000 | ORAL_TABLET | Freq: Three times a day (TID) | ORAL | 0 refills | Status: DC | PRN

## 2024-09-27 NOTE — Telephone Encounter (Signed)
 Copied from CRM [id number]. Topic: Clinical - Prescription Issue >> Sep 27, 2024  8:27 AM Maurice Ferguson POUR wrote: Reason for CRM:  Maurice Ferguson is calling about his oxyCODONE -acetaminophen  (PERCOCET) 10-325 MG tablet. Pharmacy does not show receiving the reorder to refill.  Chart Shows: 09/23/24 1059 Reorder [name], [name], RN To [phone] AND 09/23/24 1059 Reorder [name], [name], RN To [phone]  He is out of medication. Please call him with any questions or concerns at [phone]. Thanks

## 2024-09-27 NOTE — Telephone Encounter (Signed)
 Please advise [facility]

## 2024-10-23 ENCOUNTER — Encounter (HOSPITAL_COMMUNITY): Payer: Self-pay

## 2024-10-28 ENCOUNTER — Ambulatory Visit (INDEPENDENT_AMBULATORY_CARE_PROVIDER_SITE_OTHER): Payer: MEDICAID | Admitting: Nurse Practitioner

## 2024-10-28 ENCOUNTER — Encounter: Payer: Self-pay | Admitting: Nurse Practitioner

## 2024-10-28 VITALS — BP 120/85 | HR 72 | Wt 128.2 lb

## 2024-10-28 DIAGNOSIS — Z13220 Encounter for screening for lipoid disorders: Secondary | ICD-10-CM | POA: Diagnosis not present

## 2024-10-28 DIAGNOSIS — D5744 Sickle-cell thalassemia beta plus without crisis: Secondary | ICD-10-CM | POA: Diagnosis not present

## 2024-10-28 DIAGNOSIS — Z1329 Encounter for screening for other suspected endocrine disorder: Secondary | ICD-10-CM

## 2024-10-28 MED ORDER — OXYCODONE-ACETAMINOPHEN 10-325 MG PO TABS: 1.0000 | ORAL_TABLET | Freq: Three times a day (TID) | ORAL | 0 refills | Status: DC | PRN

## 2024-10-28 MED ORDER — IBUPROFEN 600 MG PO TABS: 600.0000 mg | ORAL_TABLET | Freq: Three times a day (TID) | ORAL | 0 refills | Status: DC | PRN

## 2024-10-28 NOTE — Progress Notes (Signed)
 Subjective   Patient ID: Maurice Ferguson, male    DOB: [birth year], 23 y.o.   MRN: [id number]  Chief Complaint  Patient presents with   Annual Exam    Referring provider: No ref. provider found  [name] is a 23 y.o. male with Past Medical History: No date: Acute chest pain No date: ADHD (attention deficit hyperactivity disorder)     Comment:  ADHD No date: Allergy     Comment:  seasonal allergies No date: Asthma No date: Asthma No date: Impacted teeth No date: Pneumonia No date: Sickle cell anemia (HCC) No date: Vision abnormalities     Comment:  wears glasses   HPI  Patient presents today for sickle cell follow-up and physical.  Overall he has been doing well.  He does take Percocet at home as needed.  Does not need to take this often.  Also taking ibuprofen  as needed to manage pain crisis at home.  Does need blood work today for physical. Denies f/c/s, n/v/d, hemoptysis, PND, leg swelling Denies chest pain or edema     No Known Allergies  Immunization History  Administered Date(s) Administered   Influenza Split 01/31/2012   Pneumococcal Polysaccharide-23 12/21/2014   Tdap 11/10/2022    Tobacco History: Social History   Tobacco Use  Smoking Status Never  Smokeless Tobacco Never   Counseling given: Not Answered   Outpatient Encounter Medications as of 10/28/2024  Medication Sig   folic acid  (FOLVITE ) 800 MCG tablet Take 0.5 tablets (400 mcg total) by mouth daily.   traZODone  (DESYREL ) 50 MG tablet TAKE 1 TABLET(50 MG) BY MOUTH AT BEDTIME   [DISCONTINUED] ibuprofen  (ADVIL ) 600 MG tablet Take 1 tablet (600 mg total) by mouth every 8 (eight) hours as needed.   [DISCONTINUED] oxyCODONE -acetaminophen  (PERCOCET) 10-325 MG tablet Take 1 tablet by mouth every 8 (eight) hours as needed for pain.   ibuprofen  (ADVIL ) 600 MG tablet Take 1 tablet (600 mg total) by mouth every 8 (eight) hours as needed.   oxyCODONE -acetaminophen  (PERCOCET) 10-325 MG tablet Take 1  tablet by mouth every 8 (eight) hours as needed for pain.   No facility-administered encounter medications on file as of 10/28/2024.    Review of Systems  Review of Systems  Constitutional: Negative.   HENT: Negative.    Cardiovascular: Negative.   Gastrointestinal: Negative.   Allergic/Immunologic: Negative.   Neurological: Negative.   Psychiatric/Behavioral: Negative.       Objective:   BP 120/85 (BP Location: Right Arm, Patient Position: Sitting, Cuff Size: Normal)   Pulse 72   Wt 128 lb 3.2 oz (58.2 kg)   SpO2 98%   BMI 18.39 kg/m   Wt Readings from Last 5 Encounters:  10/28/24 128 lb 3.2 oz (58.2 kg)  07/26/24 135 lb (61.2 kg)  07/17/24 135 lb 2.3 oz (61.3 kg)  07/15/24 127 lb 13.9 oz (58 kg)  06/07/24 125 lb 10.6 oz (57 kg)     Physical Exam Vitals and nursing note reviewed.  Constitutional:      General: He is not in acute distress.    Appearance: He is well-developed.  Cardiovascular:     Rate and Rhythm: Normal rate and regular rhythm.  Pulmonary:     Effort: Pulmonary effort is normal.     Breath sounds: Normal breath sounds.  Skin:    General: Skin is warm and dry.  Neurological:     Mental Status: He is alert and oriented to person, place, and time.  Assessment & Plan:   Lipid screening -     Lipid panel  Thyroid disorder screen -     TSH  Sickle cell disease, type S beta-plus thalassemia (HCC) -     ToxAssure Flex 15, Ur -     Sickle Cell Panel -     Ibuprofen ; Take 1 tablet (600 mg total) by mouth every 8 (eight) hours as needed.  Dispense: 30 tablet; Refill: 0  Other orders -     oxyCODONE -Acetaminophen ; Take 1 tablet by mouth every 8 (eight) hours as needed for pain.  Dispense: 45 tablet; Refill: 0     Return in about 3 months (around 01/28/2025).   Bascom GORMAN Borer, NP 10/28/2024

## 2024-10-29 LAB — CMP14+CBC/D/PLT+FER+RETIC+V...
ALT: 20 IU/L (ref 0–44)
AST: 17 IU/L (ref 0–40)
Albumin: 4.7 g/dL (ref 4.3–5.2)
Alkaline Phosphatase: 81 IU/L (ref 47–123)
BUN/Creatinine Ratio: 9 (ref 9–20)
BUN: 8 mg/dL (ref 6–20)
Basophils Absolute: 0 x10E3/uL (ref 0.0–0.2)
Basos: 1 %
Bilirubin Total: 1 mg/dL (ref 0.0–1.2)
CO2: 23 mmol/L (ref 20–29)
Calcium: 9.7 mg/dL (ref 8.7–10.2)
Chloride: 104 mmol/L (ref 96–106)
Creatinine, Ser: 0.94 mg/dL (ref 0.76–1.27)
EOS (ABSOLUTE): 1.2 x10E3/uL — ABNORMAL HIGH (ref 0.0–0.4)
Eos: 17 %
Ferritin: 127 ng/mL (ref 30–400)
Globulin, Total: 2.1 g/dL (ref 1.5–4.5)
Glucose: 93 mg/dL (ref 70–99)
Hematocrit: 42.9 % (ref 37.5–51.0)
Hemoglobin: 13.4 g/dL (ref 13.0–17.7)
Immature Grans (Abs): 0 x10E3/uL (ref 0.0–0.1)
Immature Granulocytes: 0 %
Lymphocytes Absolute: 2 x10E3/uL (ref 0.7–3.1)
Lymphs: 27 %
MCH: 23.4 pg — ABNORMAL LOW (ref 26.6–33.0)
MCHC: 31.2 g/dL — ABNORMAL LOW (ref 31.5–35.7)
MCV: 75 fL — ABNORMAL LOW (ref 79–97)
Monocytes Absolute: 0.5 x10E3/uL (ref 0.1–0.9)
Monocytes: 7 %
Neutrophils Absolute: 3.5 x10E3/uL (ref 1.4–7.0)
Neutrophils: 48 %
Platelets: 172 x10E3/uL (ref 150–450)
Potassium: 4 mmol/L (ref 3.5–5.2)
RBC: 5.72 x10E6/uL (ref 4.14–5.80)
RDW: 16.2 % — ABNORMAL HIGH (ref 11.6–15.4)
Retic Ct Pct: 1.6 % (ref 0.6–2.6)
Sodium: 140 mmol/L (ref 134–144)
Total Protein: 6.8 g/dL (ref 6.0–8.5)
Vit D, 25-Hydroxy: 24.9 ng/mL — ABNORMAL LOW (ref 30.0–100.0)
WBC: 7.2 x10E3/uL (ref 3.4–10.8)
eGFR: 118 mL/min/1.73 (ref >59)

## 2024-10-29 LAB — LIPID PANEL
Chol/HDL Ratio: 3.2 ratio (ref 0.0–5.0)
Cholesterol, Total: 121 mg/dL (ref 100–199)
HDL: 38 mg/dL — ABNORMAL LOW (ref >39)
LDL Chol Calc (NIH): 64 mg/dL (ref 0–99)
Triglycerides: 98 mg/dL (ref 0–149)
VLDL Cholesterol Cal: 19 mg/dL (ref 5–40)

## 2024-10-29 LAB — TSH: TSH: 0.695 u[IU]/mL (ref 0.450–4.500)

## 2024-10-31 ENCOUNTER — Ambulatory Visit: Payer: Self-pay | Admitting: Nurse Practitioner

## 2024-11-02 LAB — TOXASSURE FLEX 15, UR
6-ACETYLMORPHINE IA: NEGATIVE ng/mL
7-aminoclonazepam: NOT DETECTED ng/mg{creat}
AMPHETAMINES IA: NEGATIVE ng/mL
Alpha-hydroxyalprazolam: NOT DETECTED ng/mg{creat}
Alpha-hydroxymidazolam: NOT DETECTED ng/mg{creat}
Alpha-hydroxytriazolam: NOT DETECTED ng/mg{creat}
Alprazolam: NOT DETECTED ng/mg{creat}
BARBITURATES IA: NEGATIVE ng/mL
BUPRENORPHINE: NEGATIVE
Benzodiazepines: NEGATIVE
Buprenorphine: NOT DETECTED ng/mg{creat}
COCAINE METABOLITE IA: NEGATIVE ng/mL
Clonazepam: NOT DETECTED ng/mg{creat}
Creatinine: 93 mg/dL (ref >=20)
Desalkylflurazepam: NOT DETECTED ng/mg{creat}
Desmethyldiazepam: NOT DETECTED ng/mg{creat}
Desmethylflunitrazepam: NOT DETECTED ng/mg{creat}
Diazepam: NOT DETECTED ng/mg{creat}
ETHYL ALCOHOL Enzymatic: NEGATIVE g/dL
FENTANYL: NEGATIVE
Fentanyl: NOT DETECTED ng/mg{creat}
Flunitrazepam: NOT DETECTED ng/mg{creat}
Lorazepam: NOT DETECTED ng/mg{creat}
METHADONE IA: NEGATIVE ng/mL
METHADONE MTB IA: NEGATIVE ng/mL
Midazolam: NOT DETECTED ng/mg{creat}
Norbuprenorphine: NOT DETECTED ng/mg{creat}
Norfentanyl: NOT DETECTED ng/mg{creat}
OPIATE CLASS IA: NEGATIVE ng/mL
OXYCODONE CLASS IA: NEGATIVE ng/mL
Oxazepam: NOT DETECTED ng/mg{creat}
PHENCYCLIDINE IA: NEGATIVE ng/mL
TAPENTADOL, IA: NEGATIVE ng/mL
TRAMADOL IA: NEGATIVE ng/mL
Temazepam: NOT DETECTED ng/mg{creat}

## 2024-11-02 LAB — CANNABINOIDS, MS, UR RFX
Cannabinoids Confirmation: POSITIVE — AB
Carboxy-THC: 431 ng/mg{creat}

## 2024-11-14 ENCOUNTER — Telehealth (HOSPITAL_COMMUNITY): Payer: Self-pay | Admitting: Licensed Clinical Social Worker

## 2024-11-14 ENCOUNTER — Ambulatory Visit (HOSPITAL_COMMUNITY): Payer: MEDICAID | Admitting: Licensed Clinical Social Worker

## 2024-11-14 ENCOUNTER — Encounter (HOSPITAL_COMMUNITY): Payer: Self-pay

## 2024-11-14 NOTE — Telephone Encounter (Signed)
 LCSW sent to links to patient's phone with no response.  LCSW followed up with a phone call at 2:13 PM for 2 PM appointment.  There was no answer and LCSW left a HIPAA compliant voicemail.  LCSW stated in chat until 215 before disconnecting from new patient appointment.  Patient to be marked as a no-show for today's session

## 2024-11-27 ENCOUNTER — Ambulatory Visit: Payer: Self-pay

## 2024-11-27 ENCOUNTER — Other Ambulatory Visit: Payer: Self-pay | Admitting: Nurse Practitioner

## 2024-11-27 ENCOUNTER — Telehealth: Payer: Self-pay | Admitting: Nurse Practitioner

## 2024-11-27 ENCOUNTER — Ambulatory Visit (INDEPENDENT_AMBULATORY_CARE_PROVIDER_SITE_OTHER): Payer: MEDICAID | Admitting: Nurse Practitioner

## 2024-11-27 ENCOUNTER — Encounter: Payer: Self-pay | Admitting: Nurse Practitioner

## 2024-11-27 VITALS — BP 131/71 | HR 113 | Wt 126.2 lb

## 2024-11-27 DIAGNOSIS — J069 Acute upper respiratory infection, unspecified: Secondary | ICD-10-CM | POA: Diagnosis not present

## 2024-11-27 MED ORDER — AMOXICILLIN-POT CLAVULANATE 875-125 MG PO TABS: 1.0000 | ORAL_TABLET | Freq: Two times a day (BID) | ORAL | 0 refills | Status: AC

## 2024-11-27 MED ORDER — OXYCODONE-ACETAMINOPHEN 10-325 MG PO TABS: 1.0000 | ORAL_TABLET | Freq: Three times a day (TID) | ORAL | 0 refills | Status: DC | PRN

## 2024-11-27 MED ORDER — BENZONATATE 200 MG PO CAPS: 200.0000 mg | ORAL_CAPSULE | Freq: Two times a day (BID) | ORAL | 0 refills | Status: AC | PRN

## 2024-11-27 NOTE — Progress Notes (Signed)
 Subjective   Patient ID: Maurice Ferguson, male    DOB: 01-14-2001, 23 y.o.   MRN: [id number]  Chief Complaint  Patient presents with   Nasal Congestion   Cough   Generalized Body Aches    Referring provider: Oley Bascom RAMAN, NP  Maurice Ferguson is a 23 y.o. male with Past Medical History: No date: Acute chest pain No date: ADHD (attention deficit hyperactivity disorder)     Comment:  ADHD No date: Allergy     Comment:  seasonal allergies No date: Asthma No date: Asthma No date: Impacted teeth No date: Pneumonia No date: Sickle cell anemia (HCC) No date: Vision abnormalities     Comment:  wears glasses   HPI  Patient presents today for an acute visit.  Patient has been having upper respiratory symptoms including cough chest congestion head congestion, postnasal drip.  Symptoms started before thanksgiving.  COVID and flu swab in office today was negative.  Denies f/c/s, n/v/d, hemoptysis, PND, leg swelling Denies chest pain or edema     No Known Allergies  Immunization History  Administered Date(s) Administered   Dtap, Unspecified 03/10/2003, 04/29/2003, 07/08/2003, 02/09/2007   HIB, Unspecified 03/10/2003, 04/29/2003, 07/08/2003, 06/29/2004   Hep A, Unspecified 08/12/2005, 09/01/2006   Hep B, Unspecified 03-Apr-2003, 02/04/2003, 07/08/2003   IPV 03/10/2003, 04/29/2003, 06/29/2004, 02/09/2007   Influenza Split 01/31/2012   MMR 12/30/2003, 02/09/2007   Meningococcal B, OMV 04/12/2017, 07/12/2019   Meningococcal Conjugate 04/12/2017   Meningococcal Mcv4,unspecified 09/23/2008, 01/12/2012   Pneumococcal Conjugate PCV 7 03/10/2003, 04/29/2003, 10/08/2003, 12/30/2003   Pneumococcal Conjugate-13 01/07/2010   Pneumococcal Polysaccharide-23 05/12/2004, 09/23/2008, 12/21/2014   Tdap 11/10/2022   Varicella 12/30/2003, 08/12/2005    Tobacco History: Social History   Tobacco Use  Smoking Status Never  Smokeless Tobacco Never   Counseling given: Not  Answered   Outpatient Encounter Medications as of 11/27/2024  Medication Sig   amoxicillin -clavulanate (AUGMENTIN ) 875-125 MG tablet Take 1 tablet by mouth 2 (two) times daily.   benzonatate  (TESSALON ) 200 MG capsule Take 1 capsule (200 mg total) by mouth 2 (two) times daily as needed for cough.   folic acid  (FOLVITE ) 800 MCG tablet Take 0.5 tablets (400 mcg total) by mouth daily.   ibuprofen  (ADVIL ) 600 MG tablet Take 1 tablet (600 mg total) by mouth every 8 (eight) hours as needed.   traZODone  (DESYREL ) 50 MG tablet TAKE 1 TABLET(50 MG) BY MOUTH AT BEDTIME   [DISCONTINUED] oxyCODONE -acetaminophen  (PERCOCET) 10-325 MG tablet Take 1 tablet by mouth every 8 (eight) hours as needed for pain.   oxyCODONE -acetaminophen  (PERCOCET) 10-325 MG tablet Take 1 tablet by mouth every 8 (eight) hours as needed for pain.   No facility-administered encounter medications on file as of 11/27/2024.    Review of Systems  Review of Systems  Constitutional: Negative.   HENT:  Positive for congestion, postnasal drip, sinus pressure and sinus pain.   Respiratory:  Positive for cough.   Cardiovascular: Negative.   Gastrointestinal: Negative.   Allergic/Immunologic: Negative.   Neurological: Negative.   Psychiatric/Behavioral: Negative.       Objective:   BP 131/71 (BP Location: Left Arm, Patient Position: Sitting, Cuff Size: Normal)   Pulse (!) 113   Wt 126 lb 3.2 oz (57.2 kg)   SpO2 100%   BMI 18.11 kg/m   Wt Readings from Last 5 Encounters:  11/27/24 126 lb 3.2 oz (57.2 kg)  10/28/24 128 lb 3.2 oz (58.2 kg)  07/26/24 135 lb (61.2 kg)  07/17/24  135 lb 2.3 oz (61.3 kg)  07/15/24 127 lb 13.9 oz (58 kg)     Physical Exam Vitals and nursing note reviewed.  Constitutional:      General: He is not in acute distress.    Appearance: He is well-developed.  HENT:     Nose: Congestion present.  Cardiovascular:     Rate and Rhythm: Normal rate and regular rhythm.  Pulmonary:     Effort: Pulmonary  effort is normal.     Breath sounds: Normal breath sounds.  Skin:    General: Skin is warm and dry.  Neurological:     Mental Status: He is alert and oriented to person, place, and time.       Assessment & Plan:   Upper respiratory tract infection, unspecified type -     Amoxicillin -Pot Clavulanate; Take 1 tablet by mouth 2 (two) times daily.  Dispense: 20 tablet; Refill: 0  Other orders -     Benzonatate; Take 1 capsule (200 mg total) by mouth 2 (two) times daily as needed for cough.  Dispense: 20 capsule; Refill: 0 -     oxyCODONE -Acetaminophen ; Take 1 tablet by mouth every 8 (eight) hours as needed for pain.  Dispense: 45 tablet; Refill: 0     Return in about 3 months (around 02/25/2025).   Bascom GORMAN Borer, NP 11/27/2024

## 2024-11-27 NOTE — Telephone Encounter (Unsigned)
 Copied from CRM #[id number]. Topic: Clinical - Medication Refill >> Nov 27, 2024  9:44 AM Delon T wrote: Medication: oxyCODONE -acetaminophen  (PERCOCET) 10-325 MG tablet  Has the patient contacted their pharmacy? Yes (Agent: If no, request that the patient contact the pharmacy for the refill. If patient does not wish to contact the pharmacy document the reason why and proceed with request.) (Agent: If yes, when and what did the pharmacy advise?)  This is the patient's preferred pharmacy:  [facility] [address], [place] - [address] [place] [address] Phone: [phone] Fax: [phone]   Is this the correct pharmacy for this prescription? Yes If no, delete pharmacy and type the correct one.   Has the prescription been filled recently? Yes  Is the patient out of the medication? Yes  Has the patient been seen for an appointment in the last year OR does the patient have an upcoming appointment? Yes  Can we respond through MyChart? Yes  Agent: Please be advised that Rx refills may take up to 3 business days. We ask that you follow-up with your pharmacy.

## 2024-11-27 NOTE — Telephone Encounter (Signed)
 FYI Only or Action Required?: FYI only for provider: appointment scheduled on 11/27/24.  Patient was last seen in primary care on 10/28/2024 by Oley Bascom RAMAN, NP.  Called Nurse Triage reporting Pain.  Symptoms began today.  Interventions attempted: Rest, hydration, or home remedies.  Symptoms are: gradually worsening.  Triage Disposition: See HCP Within 4 Hours (Or PCP Triage)  Patient/caregiver understands and will follow disposition?: Yes  Copied from CRM #[id number]. Topic: Clinical - Red Word Triage >> Nov 27, 2024 12:15 PM Hadassah PARAS wrote: Red Word that prompted transfer to Nurse Triage: Pt is requesting for an appointment or have a walk-in to see PCP or nurse. Pt is experiencing worsening bodyache pain. Medication was prescribed but has not came in. Pt does not want to go to ED.  Transferred to NT Reason for Disposition  [1] SEVERE back pain (e.g., excruciating, unable to do any normal activities) AND [2] not improved 2 hours after pain medicine  Answer Assessment - Initial Assessment Questions 1. ONSET: When did the pain begin? (e.g., minutes, hours, days)     Pt called in stating he has cold like symptoms and is trying to avoid a sickle cell crisis. Pt is experiencing body aches d/t weather and cold symptoms. Pt is unable to get his prescribed medication so worried symptoms are going to trigger crisis. Pt would prefer to have an appt to discuss alternative treatments. Appointment scheduled for evaluation. Patient agrees with plan of care, and will call back if anything changes, or if symptoms worsen.  Protocols used: Back Pain-A-AH

## 2024-11-27 NOTE — Telephone Encounter (Signed)
 Copied from CRM [id number]. Topic: Clinical - Medication Refill >> Nov 27, 2024  8:16 AM Charlet HERO wrote: Medication: oxyCODONE -acetaminophen  (PERCOCET) 10-325 MG tablet ibuprofen  (ADVIL ) 600 MG tablet  Has the patient contacted their pharmacy? Yes 0 refill  This is the patient's preferred pharmacy:  [facility] [address], [place] - [address] [place] [address] Phone: [phone] Fax: [phone]   Is this the correct pharmacy for this prescription? Yes If no, delete pharmacy and type the correct one.   Has the prescription been filled recently? Yes  Is the patient out of the medication? Yes  Has the patient been seen for an appointment in the last year OR does the patient have an upcoming appointment? Yes  Can we respond through MyChart? Yes  Agent: Please be advised that Rx refills may take up to 3 business days. We ask that you follow-up with your pharmacy.

## 2024-11-29 NOTE — Telephone Encounter (Signed)
 Please advise [facility]

## 2024-12-05 ENCOUNTER — Ambulatory Visit (HOSPITAL_COMMUNITY): Payer: MEDICAID | Admitting: Licensed Clinical Social Worker

## 2024-12-10 ENCOUNTER — Ambulatory Visit (HOSPITAL_COMMUNITY): Payer: MEDICAID | Admitting: Licensed Clinical Social Worker

## 2024-12-30 ENCOUNTER — Other Ambulatory Visit: Payer: Self-pay | Admitting: Nurse Practitioner

## 2024-12-30 DIAGNOSIS — D5744 Sickle-cell thalassemia beta plus without crisis: Secondary | ICD-10-CM

## 2024-12-30 NOTE — Telephone Encounter (Signed)
 Copied from CRM [id number]. Topic: Clinical - Medication Refill >> Dec 30, 2024 12:59 PM Aleatha C wrote: Medication: oxyCODONE -acetaminophen  (PERCOCET) 10-325 MG tablet, ibuprofen  (ADVIL ) 600 MG tablet , traZODone  (DESYREL ) 50 MG tablet   Has the patient contacted their pharmacy? No (Agent: If no, request that the patient contact the pharmacy for the refill. If patient does not wish to contact the pharmacy document the reason why and proceed with request.) (Agent: If yes, when and what did the pharmacy advise?)  This is the patient's preferred pharmacy:  [facility] [address], [place] - [address] [place] [address] Phone: [phone] Fax: [phone]    Is this the correct pharmacy for this prescription? Yes If no, delete pharmacy and type the correct one.   Has the prescription been filled recently? No  Is the patient out of the medication? Yes  Has the patient been seen for an appointment in the last year OR does the patient have an upcoming appointment? Yes  Can we respond through MyChart? No  Agent: Please be advised that Rx refills may take up to 3 business days. We ask that you follow-up with your pharmacy.

## 2024-12-30 NOTE — Telephone Encounter (Unsigned)
 Copied from CRM [id number]. Topic: Clinical - Medication Refill >> Dec 30, 2024 12:17 PM Nathanel BROCKS wrote: Medication: oxyCODONE -acetaminophen  (PERCOCET) 10-325 MG tablet  Has the patient contacted their pharmacy? No  This is the patient's preferred pharmacy:  [facility] [address], [place] - [address] [place] [address] Phone: [phone] Fax: [phone]  Is this the correct pharmacy for this prescription? Yes If no, delete pharmacy and type the correct one.   Has the prescription been filled recently? Yes  Is the patient out of the medication? Yes  Has the patient been seen for an appointment in the last year OR does the patient have an upcoming appointment? Yes  Can we respond through MyChart? Yes  Agent: Please be advised that Rx refills may take up to 3 business days. We ask that you follow-up with your pharmacy.   ----------------------------------------------------------------------- From previous Reason for Contact - Scheduling: Patient/patient representative is calling to schedule an appointment. Refer to attachments for appointment information.

## 2024-12-31 NOTE — Telephone Encounter (Signed)
 Please Advise

## 2025-01-01 ENCOUNTER — Other Ambulatory Visit: Payer: Self-pay | Admitting: Nurse Practitioner

## 2025-01-01 ENCOUNTER — Other Ambulatory Visit: Payer: Self-pay

## 2025-01-01 DIAGNOSIS — D5744 Sickle-cell thalassemia beta plus without crisis: Secondary | ICD-10-CM

## 2025-01-01 MED ORDER — IBUPROFEN 600 MG PO TABS: 600.0000 mg | ORAL_TABLET | Freq: Three times a day (TID) | ORAL | 0 refills | Status: AC | PRN

## 2025-01-01 MED ORDER — OXYCODONE-ACETAMINOPHEN 10-325 MG PO TABS: 1.0000 | ORAL_TABLET | Freq: Three times a day (TID) | ORAL | 0 refills | Status: DC | PRN

## 2025-01-01 MED ORDER — TRAZODONE HCL 50 MG PO TABS: 50.0000 mg | ORAL_TABLET | Freq: Every day | ORAL | 3 refills | Status: AC

## 2025-01-01 NOTE — Telephone Encounter (Signed)
 Copied from CRM #[id number]. Topic: Clinical - Medication Question >> Jan 01, 2025 10:50 AM Tonda B wrote: Reason for CRM: patient has questions about rx  oxyCODONE -acetaminophen  (PERCOCET) 10-325 please call pt back [phone]

## 2025-01-29 ENCOUNTER — Encounter: Payer: Self-pay | Admitting: Nurse Practitioner

## 2025-01-29 ENCOUNTER — Ambulatory Visit: Payer: Self-pay | Admitting: Nurse Practitioner

## 2025-01-29 VITALS — BP 127/68 | HR 98 | Temp 97.4°F | Wt 133.4 lb

## 2025-01-29 DIAGNOSIS — D5744 Sickle-cell thalassemia beta plus without crisis: Secondary | ICD-10-CM | POA: Diagnosis not present

## 2025-01-29 MED ORDER — OXYCODONE-ACETAMINOPHEN 10-325 MG PO TABS: 1.0000 | ORAL_TABLET | Freq: Three times a day (TID) | ORAL | 0 refills | Status: AC | PRN

## 2025-01-29 NOTE — Progress Notes (Signed)
 "  Subjective   Patient ID: Maurice Ferguson, male    DOB: October 16, 2001, 24 y.o.   MRN: [id number]  Chief Complaint  Patient presents with   Follow-up    Referring provider: Oley Bascom RAMAN, NP  Maurice Ferguson is a 24 y.o. male with Past Medical History: No date: Acute chest pain No date: ADHD (attention deficit hyperactivity disorder)     Comment:  ADHD No date: Allergy     Comment:  seasonal allergies No date: Asthma No date: Asthma No date: Impacted teeth No date: Pneumonia No date: Sickle cell anemia (HCC) No date: Vision abnormalities     Comment:  wears glasses  HPI  Patient presents today for sickle cell follow-up.  Overall he has been doing well.  He does try to manage sickle cell pain at home with pain meds.  He does need a refill today.  He does need labs but the lab is closed this afternoon so he will return next week. Denies f/c/s, n/v/d, hemoptysis, PND, leg swelling Denies chest pain or edema        Allergies[1]  Immunization History  Administered Date(s) Administered   Dtap, Unspecified 03/10/2003, 04/29/2003, 07/08/2003, 02/09/2007   HIB, Unspecified 03/10/2003, 04/29/2003, 07/08/2003, 06/29/2004   Hep A, Unspecified 08/12/2005, 09/01/2006   Hep B, Unspecified 07-20-03, 02/04/2003, 07/08/2003   IPV 03/10/2003, 04/29/2003, 06/29/2004, 02/09/2007   Influenza Split 01/31/2012   MMR 12/30/2003, 02/09/2007   Meningococcal B, OMV 04/12/2017, 07/12/2019   Meningococcal Conjugate 04/12/2017   Meningococcal Mcv4,unspecified 09/23/2008, 01/12/2012   Pneumococcal Conjugate PCV 7 03/10/2003, 04/29/2003, 10/08/2003, 12/30/2003   Pneumococcal Conjugate-13 01/07/2010   Pneumococcal Polysaccharide-23 05/12/2004, 09/23/2008, 12/21/2014   Tdap 11/10/2022   Varicella 12/30/2003, 08/12/2005    Tobacco History: Tobacco Use History[2] Counseling given: Not Answered   Outpatient Encounter Medications as of 01/29/2025  Medication Sig   folic acid  (FOLVITE ) 800 MCG  tablet Take 0.5 tablets (400 mcg total) by mouth daily.   ibuprofen  (ADVIL ) 600 MG tablet Take 1 tablet (600 mg total) by mouth every 8 (eight) hours as needed.   traZODone  (DESYREL ) 50 MG tablet Take 1 tablet (50 mg total) by mouth at bedtime.   amoxicillin -clavulanate (AUGMENTIN ) 875-125 MG tablet Take 1 tablet by mouth 2 (two) times daily. (Patient not taking: Reported on 01/29/2025)   benzonatate  (TESSALON ) 200 MG capsule Take 1 capsule (200 mg total) by mouth 2 (two) times daily as needed for cough. (Patient not taking: Reported on 01/29/2025)   oxyCODONE -acetaminophen  (PERCOCET) 10-325 MG tablet Take 1 tablet by mouth every 8 (eight) hours as needed for pain.   [DISCONTINUED] oxyCODONE -acetaminophen  (PERCOCET) 10-325 MG tablet Take 1 tablet by mouth every 8 (eight) hours as needed for pain. (Patient not taking: Reported on 01/29/2025)   No facility-administered encounter medications on file as of 01/29/2025.    Review of Systems  Review of Systems  Constitutional: Negative.   HENT: Negative.    Cardiovascular: Negative.   Gastrointestinal: Negative.   Allergic/Immunologic: Negative.   Neurological: Negative.   Psychiatric/Behavioral: Negative.       Objective:   BP 127/68   Pulse 98   Temp (!) 97.4 F (36.3 C) (Temporal)   Wt 133 lb 6.4 oz (60.5 kg)   SpO2 100%   BMI 19.14 kg/m   Wt Readings from Last 5 Encounters:  01/29/25 133 lb 6.4 oz (60.5 kg)  11/27/24 126 lb 3.2 oz (57.2 kg)  10/28/24 128 lb 3.2 oz (58.2 kg)  07/26/24 135 lb (61.2  kg)  07/17/24 135 lb 2.3 oz (61.3 kg)     Physical Exam Vitals and nursing note reviewed.  Constitutional:      General: He is not in acute distress.    Appearance: He is well-developed.  Cardiovascular:     Rate and Rhythm: Normal rate and regular rhythm.  Pulmonary:     Effort: Pulmonary effort is normal.     Breath sounds: Normal breath sounds.  Skin:    General: Skin is warm and dry.  Neurological:     Mental Status: He is  alert and oriented to person, place, and time.       Assessment & Plan:   Sickle cell disease, type S beta-plus thalassemia (HCC) -     oxyCODONE -Acetaminophen ; Take 1 tablet by mouth every 8 (eight) hours as needed for pain.  Dispense: 45 tablet; Refill: 0 -     Sickle Cell Panel; Future -     ToxAssure Flex 15, Ur; Future     Return in about 3 months (around 04/28/2025).   Bascom GORMAN Borer, NP 01/29/2025     [1] No Known Allergies [2]  Social History Tobacco Use  Smoking Status Never  Smokeless Tobacco Never   "

## 2025-02-03 ENCOUNTER — Other Ambulatory Visit: Payer: Self-pay

## 2025-04-28 ENCOUNTER — Ambulatory Visit: Payer: Self-pay | Admitting: Nurse Practitioner
